# Patient Record
Sex: Female | Born: 1985 | Race: White | Hispanic: No | Smoking: Never smoker
Health system: Southern US, Community
[De-identification: ages and names within clinical notes are randomized; demographics above are authoritative.]

## PROBLEM LIST (undated history)

## (undated) ENCOUNTER — Emergency Department (HOSPITAL_BASED_OUTPATIENT_CLINIC_OR_DEPARTMENT_OTHER): Admission: EM | Payer: Medicaid Other | Source: Home / Self Care

## (undated) DIAGNOSIS — F419 Anxiety disorder, unspecified: Secondary | ICD-10-CM

## (undated) DIAGNOSIS — F909 Attention-deficit hyperactivity disorder, unspecified type: Secondary | ICD-10-CM

## (undated) HISTORY — PX: OTHER SURGICAL HISTORY: SHX169

---

## 2013-08-03 ENCOUNTER — Ambulatory Visit: Payer: Self-pay | Admitting: Physician Assistant

## 2015-03-22 IMAGING — CR DG KNEE COMPLETE 4+V*L*
1 series · 4 of 4 positions shown · non-contrast
Comparison: None available for comparison at time of study
interpretation.

CLINICAL DATA: Pain.

EXAM:
LEFT KNEE - COMPLETE 4+ VIEW

[Series 1: ap · 0.17mm/px · 4 of 4 slices shown]
[im 1/4]
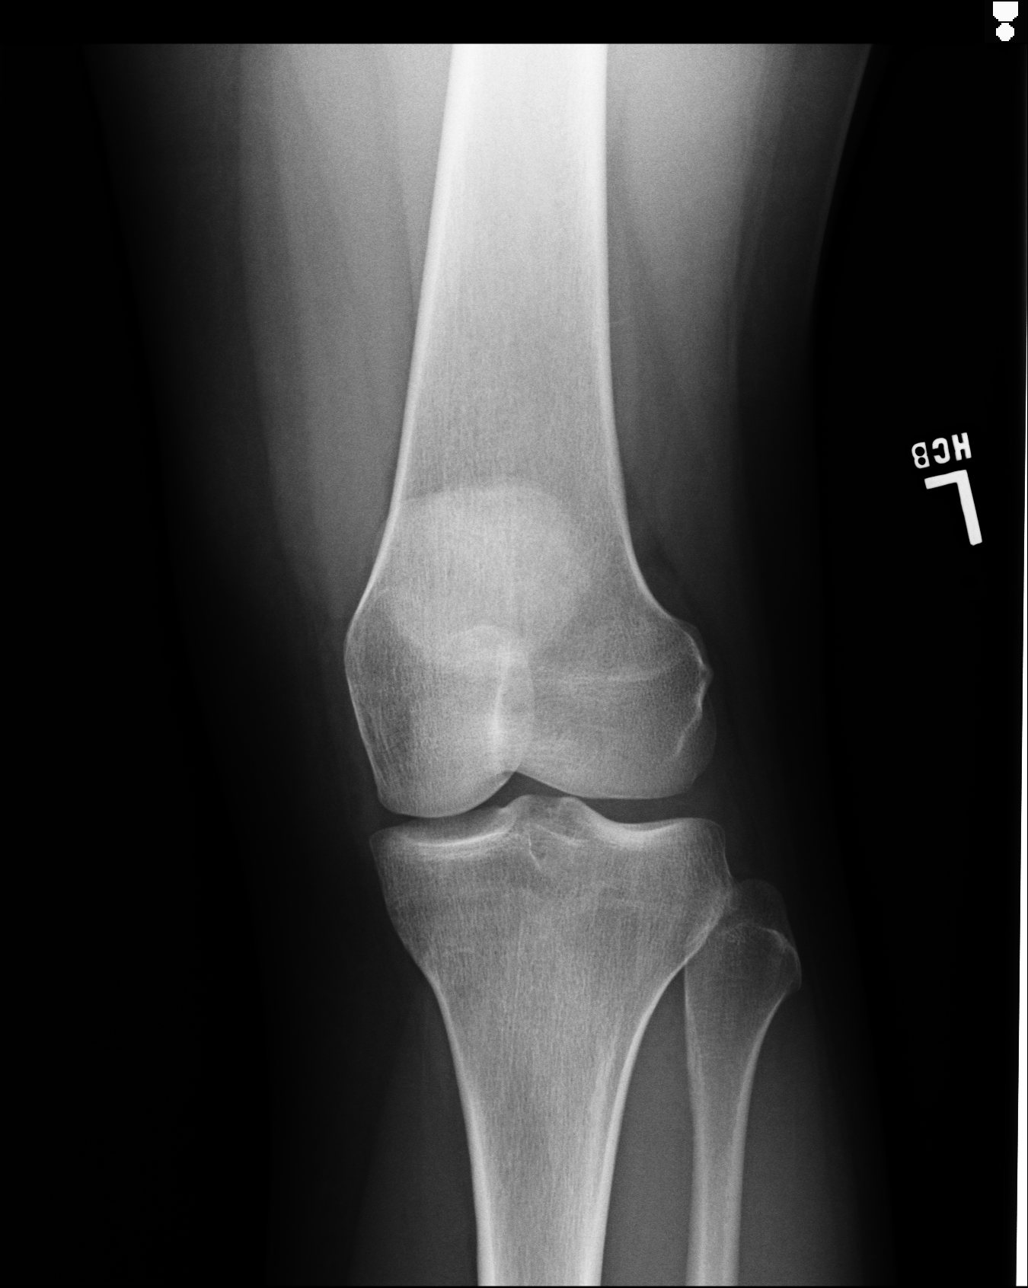
[im 2/4]
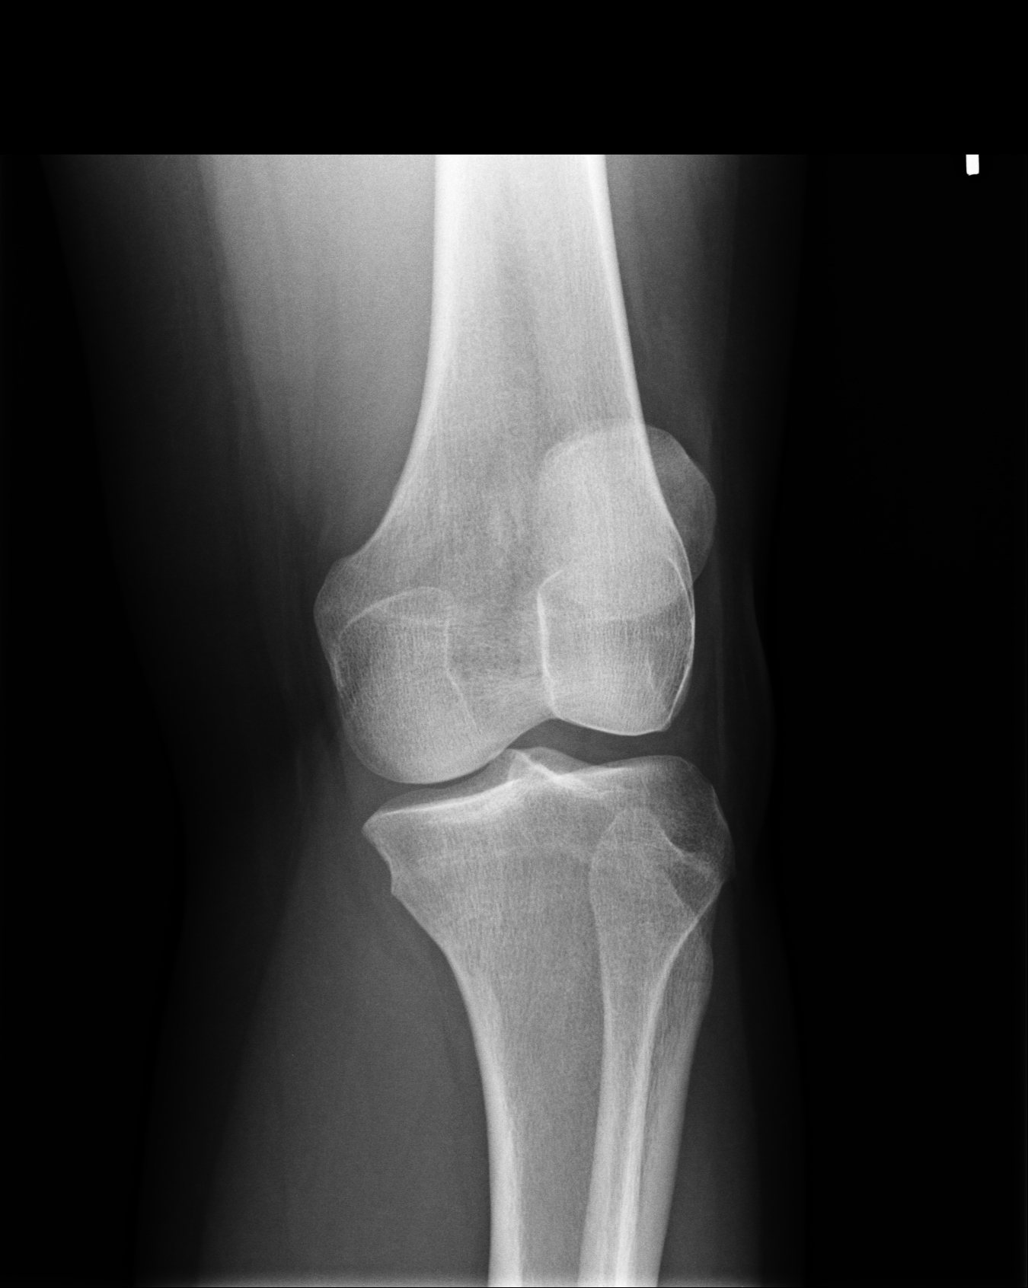
[im 3/4]
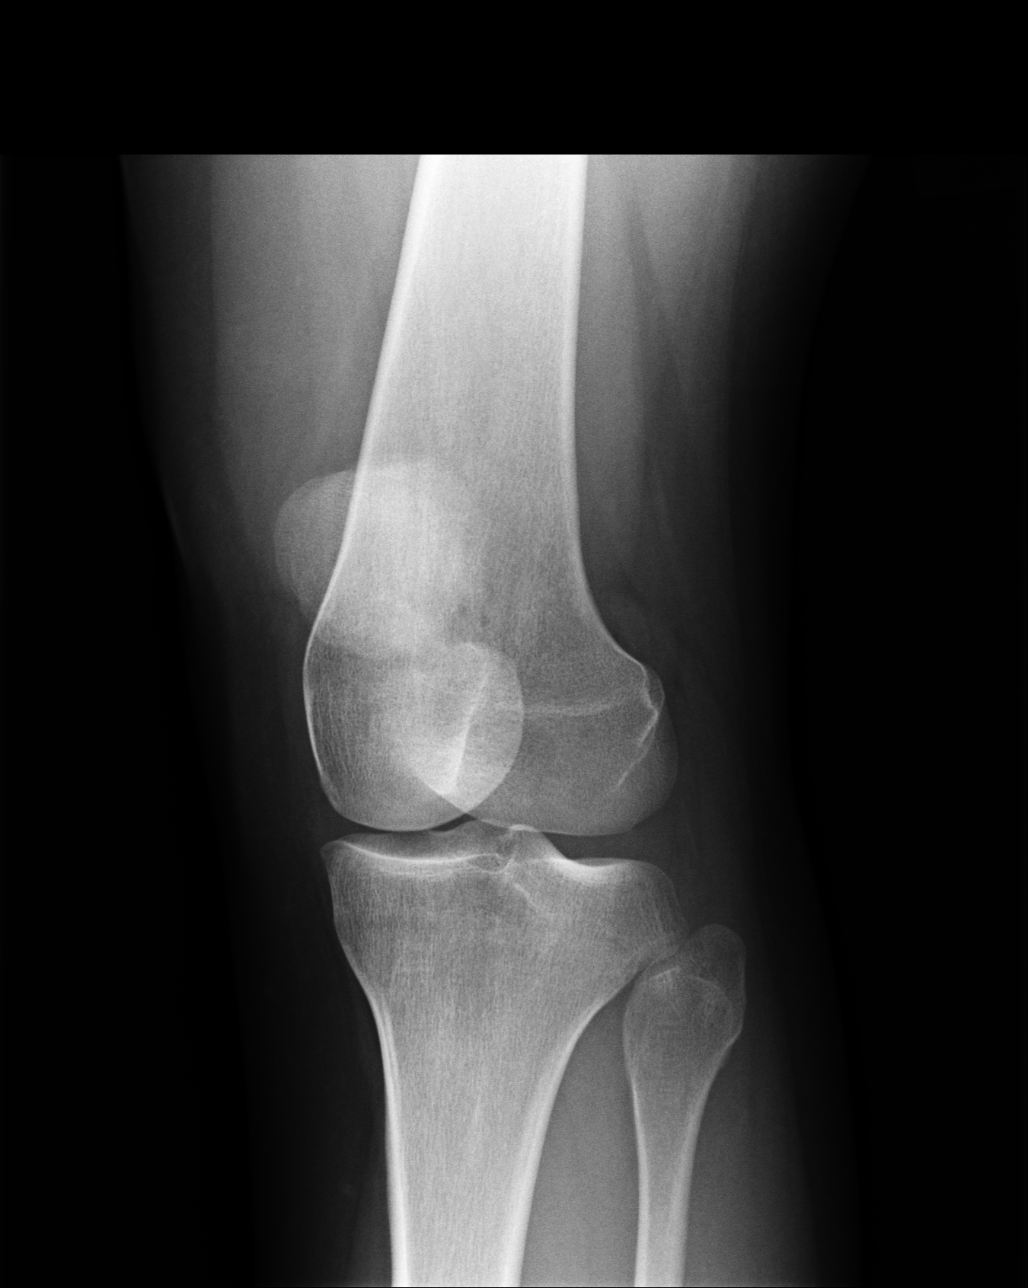
[im 4/4]
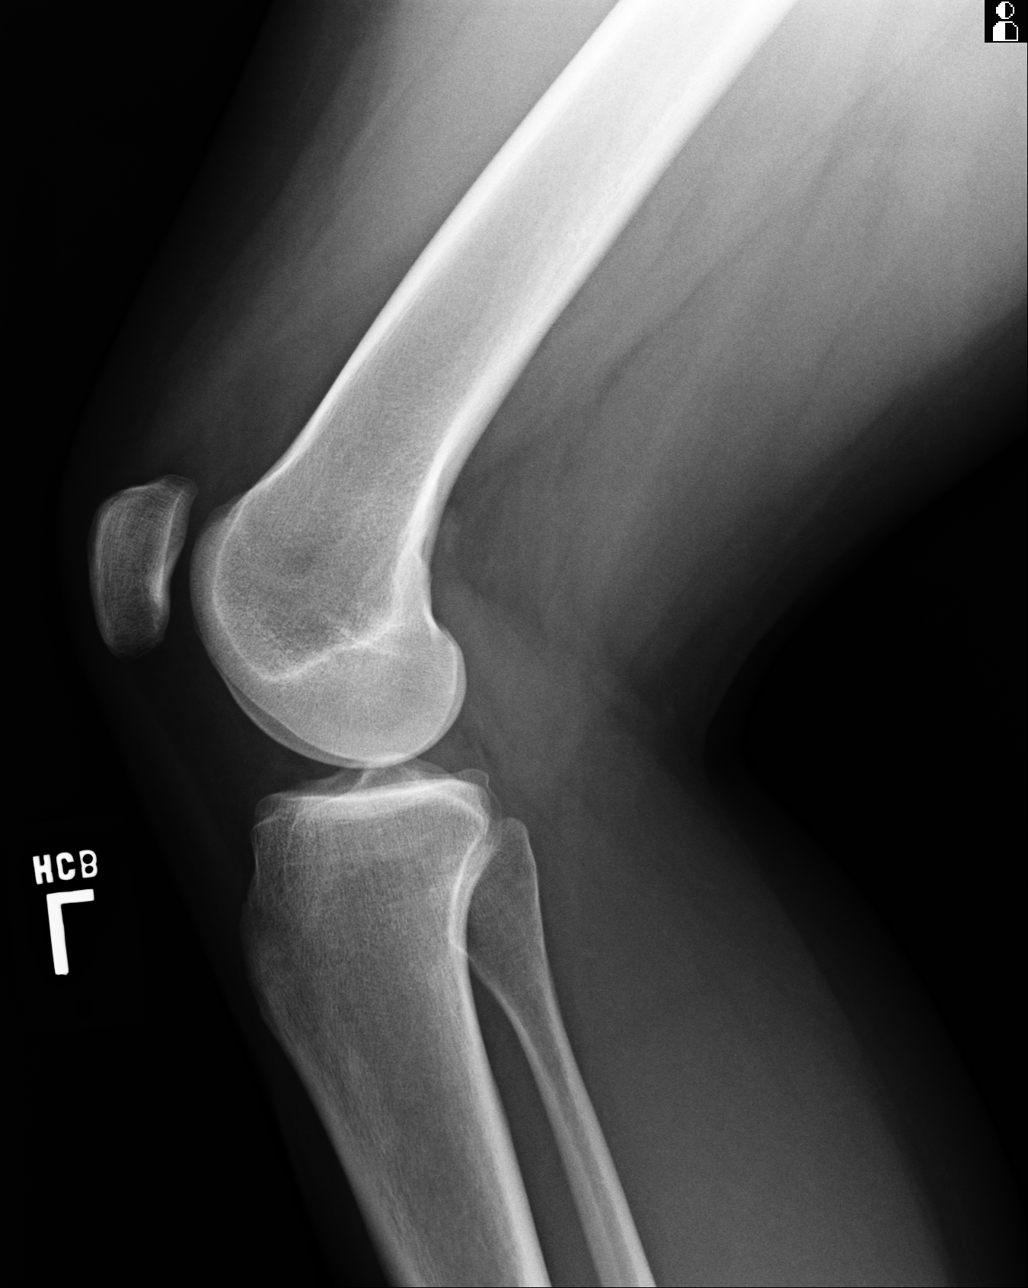

[4 of 4 positions shown; findings below may reference images not displayed]

FINDINGS: There is no evidence of fracture, dislocation, or joint effusion.
There is no evidence of arthropathy or other focal bone abnormality.
Soft tissues are nonsuspicious.
IMPRESSION: Negative.

  By: Pavani Lavigne

## 2015-07-04 ENCOUNTER — Ambulatory Visit
Admission: EM | Admit: 2015-07-04 | Discharge: 2015-07-04 | Disposition: A | Discharge status: Home / Self Care | Payer: Self-pay | Attending: Family Medicine | Admitting: Family Medicine

## 2015-07-04 DIAGNOSIS — R1084 Generalized abdominal pain: Secondary | ICD-10-CM

## 2015-07-04 DIAGNOSIS — R112 Nausea with vomiting, unspecified: Secondary | ICD-10-CM

## 2015-07-04 DIAGNOSIS — R197 Diarrhea, unspecified: Secondary | ICD-10-CM

## 2015-07-04 HISTORY — DX: Anxiety disorder, unspecified: F41.9

## 2015-07-04 LAB — RAPID INFLUENZA A&B ANTIGENS
Influenza A (ARMC): NEGATIVE
Influenza B (ARMC): NEGATIVE

## 2015-07-04 LAB — URINALYSIS COMPLETE WITH MICROSCOPIC (ARMC ONLY)
BILIRUBIN URINE: NEGATIVE
Bacteria, UA: NONE SEEN
Glucose, UA: NEGATIVE mg/dL
Hgb urine dipstick: NEGATIVE
KETONES UR: NEGATIVE mg/dL
LEUKOCYTES UA: NEGATIVE
Nitrite: NEGATIVE
PH: 7 (ref 5.0–8.0)
Protein, ur: NEGATIVE mg/dL
RBC / HPF: NONE SEEN RBC/hpf (ref 0–5)
Specific Gravity, Urine: 1.02 (ref 1.005–1.030)

## 2015-07-04 LAB — PREGNANCY, URINE: Preg Test, Ur: NEGATIVE

## 2015-07-04 MED ORDER — ONDANSETRON HCL 4 MG/2ML IJ SOLN: 4.0000 mg | Freq: Once | INTRAMUSCULAR | Status: DC

## 2015-07-04 MED ORDER — ACETAMINOPHEN 500 MG PO TABS
1000.0000 mg | ORAL_TABLET | Freq: Once | ORAL | Status: AC
  Administered 2015-07-04: 1000 mg via ORAL

## 2015-07-04 MED ORDER — ONDANSETRON 4 MG PO TBDP
4.0000 mg | ORAL_TABLET | Freq: Once | ORAL | Status: AC
  Administered 2015-07-04: 4 mg via ORAL

## 2015-07-04 MED ORDER — ONDANSETRON 4 MG PO TBDP: 4.0000 mg | ORAL_TABLET | Freq: Three times a day (TID) | ORAL | Status: DC | PRN

## 2015-07-04 NOTE — Discharge Instructions (Signed)
Take medication as prescribed. Rest. Drink plenty of fluids. Follow BRAT diet.   Follow up with your primary care physician this week as needed. Return to Urgent care or proceed to ER for continued abdominal pain, inability to tolerate food or fluids, dizziness, weakness, new or worsening concerns.    Abdominal Pain, Adult Many things can cause abdominal pain. Usually, abdominal pain is not caused by a disease and will improve without treatment. It can often be observed and treated at home. Your health care provider will do a physical exam and possibly order blood tests and X-rays to help determine the seriousness of your pain. However, in many cases, more time must pass before a clear cause of the pain can be found. Before that point, your health care provider may not know if you need more testing or further treatment. HOME CARE INSTRUCTIONS Monitor your abdominal pain for any changes. The following actions may help to alleviate any discomfort you are experiencing:  Only take over-the-counter or prescription medicines as directed by your health care provider.  Do not take laxatives unless directed to do so by your health care provider.  Try a clear liquid diet (broth, tea, or water) as directed by your health care provider. Slowly move to a bland diet as tolerated. SEEK MEDICAL CARE IF:  You have unexplained abdominal pain.  You have abdominal pain associated with nausea or diarrhea.  You have pain when you urinate or have a bowel movement.  You experience abdominal pain that wakes you in the night.  You have abdominal pain that is worsened or improved by eating food.  You have abdominal pain that is worsened with eating fatty foods.  You have a fever. SEEK IMMEDIATE MEDICAL CARE IF:  Your pain does not go away within 2 hours.  You keep throwing up (vomiting).  Your pain is felt only in portions of the abdomen, such as the right side or the left lower portion of the  abdomen.  You pass bloody or black tarry stools. MAKE SURE YOU:  Understand these instructions.  Will watch your condition.  Will get help right away if you are not doing well or get worse.   This information is not intended to replace advice given to you by your health care provider. Make sure you discuss any questions you have with your health care provider.   Document Released: 01/20/2005 Document Revised: 01/01/2015 Document Reviewed: 12/20/2012 Elsevier Interactive Patient Education 2016 [employer] Inc[employer]  Diarrhea Diarrhea is frequent loose and watery bowel movements. It can cause you to feel weak and dehydrated. Dehydration can cause you to become tired and thirsty, have a dry mouth, and have decreased urination that often is dark yellow. Diarrhea is a sign of another problem, most often an infection that will not last long. In most cases, diarrhea typically lasts 2-3 days. However, it can last longer if it is a sign of something more serious. It is important to treat your diarrhea as directed by your caregiver to lessen or prevent future episodes of diarrhea. CAUSES  Some common causes include:  Gastrointestinal infections caused by viruses, bacteria, or parasites.  Food poisoning or food allergies.  Certain medicines, such as antibiotics, chemotherapy, and laxatives.  Artificial sweeteners and fructose.  Digestive disorders. HOME CARE INSTRUCTIONS  Ensure adequate fluid intake (hydration): Have 1 cup (8 oz) of fluid for each diarrhea episode. Avoid fluids that contain simple sugars or sports drinks, fruit juices, whole milk products, and sodas. Your urine should be  clear or pale yellow if you are drinking enough fluids. Hydrate with an oral rehydration solution that you can purchase at pharmacies, retail stores, and online. You can prepare an oral rehydration solution at home by mixing the following ingredients together:   - tsp table salt.   tsp baking soda.   tsp salt  substitute containing potassium chloride.  1  tablespoons sugar.  1 L (34 oz) of water.  Certain foods and beverages may increase the speed at which food moves through the gastrointestinal (GI) tract. These foods and beverages should be avoided and include:  Caffeinated and alcoholic beverages.  High-fiber foods, such as raw fruits and vegetables, nuts, seeds, and whole grain breads and cereals.  Foods and beverages sweetened with sugar alcohols, such as xylitol, sorbitol, and mannitol.  Some foods may be well tolerated and may help thicken stool including:  Starchy foods, such as rice, toast, pasta, low-sugar cereal, oatmeal, grits, baked potatoes, crackers, and bagels.  Bananas.  Applesauce.  Add probiotic-rich foods to help increase healthy bacteria in the GI tract, such as yogurt and fermented milk products.  Wash your hands well after each diarrhea episode.  Only take over-the-counter or prescription medicines as directed by your caregiver.  Take a warm bath to relieve any burning or pain from frequent diarrhea episodes. SEEK IMMEDIATE MEDICAL CARE IF:   You are unable to keep fluids down.  You have persistent vomiting.  You have blood in your stool, or your stools are black and tarry.  You do not urinate in 6-8 hours, or there is only a small amount of very dark urine.  You have abdominal pain that increases or localizes.  You have weakness, dizziness, confusion, or light-headedness.  You have a severe headache.  Your diarrhea gets worse or does not get better.  You have a fever or persistent symptoms for more than 2-3 days.  You have a fever and your symptoms suddenly get worse. MAKE SURE YOU:   Understand these instructions.  Will watch your condition.  Will get help right away if you are not doing well or get worse.   This information is not intended to replace advice given to you by your health care provider. Make sure you discuss any questions you  have with your health care provider.   Document Released: 04/02/2002 Document Revised: 05/03/2014 Document Reviewed: 12/19/2011 Elsevier Interactive Patient Education 2016 [employer].  Food Choices to Help Relieve Diarrhea, Adult When you have diarrhea, the foods you eat and your eating habits are very important. Choosing the right foods and drinks can help relieve diarrhea. Also, because diarrhea can last up to 7 days, you need to replace lost fluids and electrolytes (such as sodium, potassium, and chloride) in order to help prevent dehydration.  WHAT GENERAL GUIDELINES DO I NEED TO FOLLOW?  Slowly drink 1 cup (8 oz) of fluid for each episode of diarrhea. If you are getting enough fluid, your urine will be clear or pale yellow.  Eat starchy foods. Some good choices include white rice, white toast, pasta, low-fiber cereal, baked potatoes (without the skin), saltine crackers, and bagels.  Avoid large servings of any cooked vegetables.  Limit fruit to two servings per day. A serving is  cup or 1 small piece.  Choose foods with less than 2 g of fiber per serving.  Limit fats to less than 8 tsp (38 g) per day.  Avoid fried foods.  Eat foods that have probiotics in them. Probiotics can be  found in certain dairy products.  Avoid foods and beverages that may increase the speed at which food moves through the stomach and intestines (gastrointestinal tract). Things to avoid include:  High-fiber foods, such as dried fruit, raw fruits and vegetables, nuts, seeds, and whole grain foods.  Spicy foods and high-fat foods.  Foods and beverages sweetened with high-fructose corn syrup, honey, or sugar alcohols such as xylitol, sorbitol, and mannitol. WHAT FOODS ARE RECOMMENDED? Grains White rice. White, [place], or pita breads (fresh or toasted), including plain rolls, buns, or bagels. White pasta. Saltine, soda, or graham crackers. Pretzels. Low-fiber cereal. Cooked cereals made with water (such  as cornmeal, farina, or cream cereals). Plain muffins. Matzo. Melba toast. Zwieback.  Vegetables Potatoes (without the skin). Strained tomato and vegetable juices. Most well-cooked and canned vegetables without seeds. Tender lettuce. Fruits Cooked or canned applesauce, apricots, cherries, fruit cocktail, grapefruit, peaches, pears, or plums. Fresh bananas, apples without skin, cherries, grapes, cantaloupe, grapefruit, peaches, oranges, or plums.  Meat and Other Protein Products Baked or boiled chicken. Eggs. Tofu. Fish. Seafood. Smooth peanut butter. Ground or well-cooked tender beef, ham, veal, lamb, pork, or poultry.  Dairy Plain yogurt, kefir, and unsweetened liquid yogurt. Lactose-free milk, buttermilk, or soy milk. Plain hard cheese. Beverages Sport drinks. Clear broths. Diluted fruit juices (except prune). Regular, caffeine-free sodas such as ginger ale. Water. Decaffeinated teas. Oral rehydration solutions. Sugar-free beverages not sweetened with sugar alcohols. Other Bouillon, broth, or soups made from recommended foods.  The items listed above may not be a complete list of recommended foods or beverages. Contact your dietitian for more options. WHAT FOODS ARE NOT RECOMMENDED? Grains Whole grain, whole wheat, bran, or rye breads, rolls, pastas, crackers, and cereals. Wild or brown rice. Cereals that contain more than 2 g of fiber per serving. Corn tortillas or taco shells. Cooked or dry oatmeal. Granola. Popcorn. Vegetables Raw vegetables. Cabbage, broccoli, Brussels sprouts, artichokes, baked beans, beet greens, corn, kale, legumes, peas, sweet potatoes, and yams. Potato skins. Cooked spinach and cabbage. Fruits Dried fruit, including raisins and dates. Raw fruits. Stewed or dried prunes. Fresh apples with skin, apricots, mangoes, pears, raspberries, and strawberries.  Meat and Other Protein Products Chunky peanut butter. Nuts and seeds. Beans and lentils. Tomasa Blase.  Dairy High-fat  cheeses. Milk, chocolate milk, and beverages made with milk, such as milk shakes. Cream. Ice cream. Sweets and Desserts Sweet rolls, doughnuts, and sweet breads. Pancakes and waffles. Fats and Oils Butter. Cream sauces. Margarine. Salad oils. Plain salad dressings. Olives. Avocados.  Beverages Caffeinated beverages (such as coffee, tea, soda, or energy drinks). Alcoholic beverages. Fruit juices with pulp. Prune juice. Soft drinks sweetened with high-fructose corn syrup or sugar alcohols. Other Coconut. Hot sauce. Chili powder. Mayonnaise. Gravy. Cream-based or milk-based soups.  The items listed above may not be a complete list of foods and beverages to avoid. Contact your dietitian for more information. WHAT SHOULD I DO IF I BECOME DEHYDRATED? Diarrhea can sometimes lead to dehydration. Signs of dehydration include dark urine and dry mouth and skin. If you think you are dehydrated, you should rehydrate with an oral rehydration solution. These solutions can be purchased at pharmacies, retail stores, or online.  Drink -1 cup (120-240 mL) of oral rehydration solution each time you have an episode of diarrhea. If drinking this amount makes your diarrhea worse, try drinking smaller amounts more often. For example, drink 1-3 tsp (5-15 mL) every 5-10 minutes.  A general rule for staying hydrated is to drink 1-2  L of fluid per day. Talk to your health care provider about the specific amount you should be drinking each day. Drink enough fluids to keep your urine clear or pale yellow.   This information is not intended to replace advice given to you by your health care provider. Make sure you discuss any questions you have with your health care provider.   Document Released: 07/03/2003 Document Revised: 05/03/2014 Document Reviewed: 03/05/2013 Elsevier Interactive Patient Education 2016 Elsevier Inc.  Nausea and Vomiting Nausea means you feel sick to your stomach. Throwing up (vomiting) is a reflex  where stomach contents come out of your mouth. HOME CARE   Take medicine as told by your doctor.  Do not force yourself to eat. However, you do need to drink fluids.  If you feel like eating, eat a normal diet as told by your doctor.  Eat rice, wheat, potatoes, bread, lean meats, yogurt, fruits, and vegetables.  Avoid high-fat foods.  Drink enough fluids to keep your pee (urine) clear or pale yellow.  Ask your doctor how to replace body fluid losses (rehydrate). Signs of body fluid loss (dehydration) include:  Feeling very thirsty.  Dry lips and mouth.  Feeling dizzy.  Dark pee.  Peeing less than normal.  Feeling confused.  Fast breathing or heart rate. GET HELP RIGHT AWAY IF:   You have blood in your throw up.  You have black or bloody poop (stool).  You have a bad headache or stiff neck.  You feel confused.  You have bad belly (abdominal) pain.  You have chest pain or trouble breathing.  You do not pee at least once every 8 hours.  You have cold, clammy skin.  You keep throwing up after 24 to 48 hours.  You have a fever. MAKE SURE YOU:   Understand these instructions.  Will watch your condition.  Will get help right away if you are not doing well or get worse.   This information is not intended to replace advice given to you by your health care provider. Make sure you discuss any questions you have with your health care provider.   Document Released: 09/29/2007 Document Revised: 07/05/2011 Document Reviewed: 09/11/2010 Elsevier Interactive Patient Education [employer]

## 2015-07-04 NOTE — ED Provider Notes (Signed)
[facility]  ____________________________________________  Time seen: Approximately 2:39 PM  I have reviewed the triage vital signs and the nursing notes.   HISTORY  Chief Complaint Abdominal Pain   HPI Maria Jones is a 30 y.o. female presents with spouse at bedside with complaints of onset of nausea, vomiting, diarrhea and abdominal pain since 3 AM this morning. Patient reports that she has had countless episodes of diarrhea today but with 2-3 episodes of vomiting. Patient states that she has had continued nausea. Patient states that her abdominal pain is a crampy generalized abdominal pain that gets worse) needing to vomit or have diarrhea and then improves after vomiting or diarrhea. Denies abnormal colors or vomiting or diarrhea. Denies blood in stool or vomit and denies stool or vomit color changes. Reports has tolerated some Gatorade today.  States current abdominal discomfort is generalized at 8 out of 10 cramping. Patient states that her daughter also woke up in the middle the night with similar symptoms and has continued with vomiting and diarrhea today. Denies known trigger. Denies changes in foods. Denies recent sickness. Also reports accompanying chills and states that she had a 101 fever earlier. States has not taken any medications today.  Denies dysuria, vaginal discharge, vaginal complaints, neck pain, back pain, rash, recent sickness.     Past Medical History  Diagnosis Date  . Anxiety     There are no active problems to display for this patient.   Past Surgical History  Procedure Laterality Date  . C section     . Leep surgery      Current Outpatient Rx  Name  Route  Sig  Dispense  Refill  . LORazepam (ATIVAN) 0.5 MG tablet   Oral   Take 0.5 mg by mouth as needed for anxiety.         .             Allergies Antihistamines, chlorpheniramine-type  History reviewed. No pertinent family history.  Social History Social History   Substance Use Topics  . Smoking status: Never Smoker   . Smokeless tobacco: Never Used  . Alcohol Use: No    Review of Systems Constitutional: positive chills.  Eyes: No visual changes. ENT: No sore throat. Cardiovascular: Denies chest pain. Respiratory: Denies shortness of breath. Gastrointestinal: as above.  No constipation. Genitourinary: Negative for dysuria. Musculoskeletal: Negative for back pain. Skin: Negative for rash. Neurological: Negative for headaches, focal weakness or numbness.  10-point ROS otherwise negative.  ____________________________________________   PHYSICAL EXAM:  VITAL SIGNS: ED Triage Vitals  Enc Vitals Group     BP 07/04/15 1340 101/59 mmHg     Pulse Rate 07/04/15 1340 110     Resp 07/04/15 1340 19     Temp 07/04/15 1340 100.2 F (37.9 C)     Temp Source 07/04/15 1340 Oral     SpO2 07/04/15 1340 100 %     Weight 07/04/15 1340 134 lb (60.782 kg)     Height 07/04/15 1340 5\' 6"  (1.676 m)     Head Cir --      Peak Flow --      Pain Score 07/04/15 1346 10     Pain Loc --      Pain Edu? --      Excl. in GC? --    Today's Vitals   07/04/15 1340 07/04/15 1346 07/04/15 1515 07/04/15 1538  BP: 101/59  106/65   Pulse: 110  105   Temp: 100.2 F (  37.9 C)  98.9 F (37.2 C)   TempSrc: Oral  Oral   Resp: 19  20   Height:  (1.676 m)     Weight: 134 lb (60.782 kg)     SpO2: 100%  98%   PainSc:  10-Worst pain ever  8      Constitutional: Alert and oriented. Well appearing and in no acute distress. Eyes: Conjunctivae are normal. PERRL. EOMI. Head: Atraumatic.  Ears: no erythema, normal TMs bilaterally.   Nose: No congestion/rhinnorhea.  Mouth/Throat: Mucous membranes are moist.  Oropharynx non-erythematous. Neck: No stridor.  No cervical spine tenderness to palpation. Hematological/Lymphatic/Immunilogical: No cervical lymphadenopathy. Cardiovascular: Normal rate, regular rhythm. Grossly normal heart sounds.  Good peripheral  circulation. Respiratory: Normal respiratory effort.  No retractions. Lungs CTAB. Gastrointestinal: soft, diffuse mild to moderate tenderness to palpation. Normal Bowel sounds. No CVA tenderness. Musculoskeletal: No lower or upper extremity tenderness nor edema.  No cervical, thoracic or lumbar tenderness to palpation.  Neurologic:  Normal speech and language. No gross focal neurologic deficits are appreciated. No gait instability. Skin:  Skin is warm, dry and intact. No rash noted. Psychiatric: Mood and affect are normal. Speech and behavior are normal.  ____________________________________________   LABS (all labs ordered are listed, but only abnormal results are displayed)  Labs Reviewed  URINALYSIS COMPLETEWITH MICROSCOPIC ([facility] ONLY) - Abnormal; Notable for the following:    Squamous Epithelial / LPF 0-5 (*)    All other components within normal limits  RAPID INFLUENZA A&B ANTIGENS ([facility] ONLY)  URINE CULTURE  PREGNANCY, URINE    INITIAL IMPRESSION / ASSESSMENT AND PLAN / ED COURSE  Pertinent labs & imaging results that were available during my care of the patient were reviewed by me and considered in my medical decision making (see chart for details).  Overall well-appearing patient. No acute distress. Spouse at bedside. Nausea vomiting diarrhea with abdominal pain since 3 AM this morning. No vomiting or diarrhea while in urgent care. OT 4 mg Zofran given, patient reports that after Zofran nausea improving as well as abdominal complaints. Diffuse abdominal tenderness. Urinalysis unremarkable. Urine pregnancy test negative. Influenza test negative.Patient states that she needs to leave urgent care to take her daughter to the doctor. Patient states that she does not want to stay for by mouth challenge or for laboratory studies. Suspect viral gastroenteritis and discussed with patient treatment with ODT Zofran, fluids, BRAT diet. As patient states that she cannot wait in urgent care and  wants to be discharged at this time, counseled patient very strict follow-up and return parameters. Counseled patient to go directly to emergency room as needed for increased abdominal pain, inability to tolerate food or fluids, new or worsening concerns. Patient verbalized understanding and agreed to this plan.   Discussed follow up with Primary care physician this week. Discussed follow up and return parameters including no resolution or any worsening concerns. Patient verbalized understanding and agreed to plan.   ____________________________________________   FINAL CLINICAL IMPRESSION(S) / ED DIAGNOSES  Final diagnoses:  Nausea vomiting and diarrhea  Generalized abdominal pain      Note: This dictation was prepared with Dragon dictation along with smaller phrase technology. Any transcriptional errors that result from this process are unintentional.    Renford Dills, NP 07/04/15 1552

## 2015-07-04 NOTE — ED Notes (Signed)
Patient c/o stomach pain, body aches, chills, diarrhea, and n/v which started at 3:00am this morning.

## 2015-07-06 LAB — URINE CULTURE

## 2016-04-25 ENCOUNTER — Encounter: Payer: Self-pay | Admitting: Emergency Medicine

## 2016-04-25 ENCOUNTER — Emergency Department
Admission: EM | Admit: 2016-04-25 | Discharge: 2016-04-25 | Disposition: A | Discharge status: Home / Self Care | Payer: Medicaid Other | Attending: Emergency Medicine | Admitting: Emergency Medicine

## 2016-04-25 DIAGNOSIS — R509 Fever, unspecified: Secondary | ICD-10-CM | POA: Diagnosis present

## 2016-04-25 DIAGNOSIS — J069 Acute upper respiratory infection, unspecified: Secondary | ICD-10-CM | POA: Diagnosis not present

## 2016-04-25 LAB — CBC WITH DIFFERENTIAL/PLATELET
BASOS ABS: 0.1 10*3/uL (ref 0–0.1)
Basophils Relative: 1 %
Eosinophils Absolute: 0.1 10*3/uL (ref 0–0.7)
Eosinophils Relative: 1 %
HEMATOCRIT: 36.5 % (ref 35.0–47.0)
HEMOGLOBIN: 12.5 g/dL (ref 12.0–16.0)
LYMPHS PCT: 28 %
Lymphs Abs: 1.7 10*3/uL (ref 1.0–3.6)
MCH: 29.4 pg (ref 26.0–34.0)
MCHC: 34.2 g/dL (ref 32.0–36.0)
MCV: 85.8 fL (ref 80.0–100.0)
MONO ABS: 0.4 10*3/uL (ref 0.2–0.9)
MONOS PCT: 6 %
NEUTROS ABS: 4 10*3/uL (ref 1.4–6.5)
NEUTROS PCT: 64 %
Platelets: 226 10*3/uL (ref 150–440)
RBC: 4.26 MIL/uL (ref 3.80–5.20)
RDW: 15.1 % — AB (ref 11.5–14.5)
WBC: 6.3 10*3/uL (ref 3.6–11.0)

## 2016-04-25 LAB — BASIC METABOLIC PANEL
ANION GAP: 7 (ref 5–15)
BUN: 8 mg/dL (ref 6–20)
CALCIUM: 9.4 mg/dL (ref 8.9–10.3)
CHLORIDE: 105 mmol/L (ref 101–111)
CO2: 25 mmol/L (ref 22–32)
Creatinine, Ser: 0.65 mg/dL (ref 0.44–1.00)
GFR calc Af Amer: >60 mL/min (ref >60)
GFR calc non Af Amer: >60 mL/min (ref >60)
GLUCOSE: 90 mg/dL (ref 65–99)
POTASSIUM: 3.7 mmol/L (ref 3.5–5.1)
Sodium: 137 mmol/L (ref 135–145)

## 2016-04-25 LAB — POCT RAPID STREP A: STREPTOCOCCUS, GROUP A SCREEN (DIRECT): NEGATIVE

## 2016-04-25 LAB — INFLUENZA PANEL BY PCR (TYPE A & B)
Influenza A By PCR: NEGATIVE
Influenza B By PCR: NEGATIVE

## 2016-04-25 MED ORDER — AZITHROMYCIN 250 MG PO TABS: ORAL_TABLET | ORAL | 0 refills | Status: DC

## 2016-04-25 NOTE — ED Notes (Signed)
Pt reports a lump on the right side of her neck for approx 1 month and it has caused some ear pain  She reports last night she began experiencing fever and chills  5/10 ear pain upon assessment

## 2016-04-25 NOTE — ED Provider Notes (Signed)
[facility] Emergency Department Provider Note   ____________________________________________   None    (approximate)  I have reviewed the triage vital signs and the nursing notes.   HISTORY  Chief Complaint Fever and Otalgia    HPI Maria Jones is a 30 y.o. female presents for evaluation of right-sided earache right-sided lymph node swelling. MAXIMUM TEMPERATURE 101. States that she's had a sick daughter at home and wants to make sure she isn't getting sick. Complains of congestion.   Past Medical History:  Diagnosis Date  . Anxiety     There are no active problems to display for this patient.   Past Surgical History:  Procedure Laterality Date  . C Section     . LEEP Surgery      Prior to Admission medications   Medication Sig Start Date End Date Taking? Authorizing Provider  azithromycin (ZITHROMAX Z-PAK) 250 MG tablet Take 2 tablets (500 mg) on  Day 1,  followed by 1 tablet (250 mg) once daily on Days 2 through 5. 04/25/16   Charmayne Sheerharles M Freedom Lopezperez, PA-C  LORazepam (ATIVAN) 0.5 MG tablet Take 0.5 mg by mouth as needed for anxiety.    Historical Provider, MD  ondansetron (ZOFRAN ODT) 4 MG disintegrating tablet Take 1 tablet (4 mg total) by mouth every 8 (eight) hours as needed for nausea or vomiting. 07/04/15   Renford DillsLindsey Miller, NP    Allergies Antihistamines, chlorpheniramine-type  No family history on file.  Social History Social History  Substance Use Topics  . Smoking status: Never Smoker  . Smokeless tobacco: Never Used  . Alcohol use No    Review of Systems Constitutional: Positive fever/chills Eyes: No visual changes. ENT: Positive sore throat. Cardiovascular: Denies chest pain. Respiratory: Denies shortness of breath. Gastrointestinal: No abdominal pain.  No nausea, no vomiting.  No diarrhea.  No constipation. Genitourinary: Negative for dysuria. Musculoskeletal: Negative for back pain. Skin: Negative for  rash. Neurological: Positive for headaches, focal weakness or numbness.  10-point ROS otherwise negative.  ____________________________________________   PHYSICAL EXAM:  VITAL SIGNS: ED Triage Vitals  Enc Vitals Group     BP 04/25/16 1211 136/83     Pulse Rate 04/25/16 1211 83     Resp 04/25/16 1211 16     Temp 04/25/16 1211 98.1 F (36.7 C)     Temp Source 04/25/16 1211 Oral     SpO2 04/25/16 1211 100 %     Weight 04/25/16 1212 137 lb (62.1 kg)     Height 04/25/16 1212 5' 6.5" (1.689 m)     Head Circumference --      Peak Flow --      Pain Score 04/25/16 1214 6     Pain Loc --      Pain Edu? --      Excl. in GC? --     Constitutional: Alert and oriented. Well appearing and in no acute distress. Eyes: Conjunctivae are normal. PERRL. EOMI. Head: Atraumatic. Nose: No congestion/rhinnorhea. Mouth/Throat: Mucous membranes are moist.  Oropharynx non-erythematous. Neck: No stridor.   Cardiovascular: Normal rate, regular rhythm. Grossly normal heart sounds.  Good peripheral circulation. Respiratory: Normal respiratory effort.  No retractions. Lungs CTAB. Gastrointestinal: Soft and nontender. No distention. No abdominal bruits. No CVA tenderness. Musculoskeletal: No lower extremity tenderness nor edema.  No joint effusions. Neurologic:  Normal speech and language. No gross focal neurologic deficits are appreciated. No gait instability. Skin:  Skin is warm, dry and intact. No rash noted. Psychiatric:  Mood and affect are normal. Speech and behavior are normal.  ____________________________________________   LABS (all labs ordered are listed, but only abnormal results are displayed)  Labs Reviewed  CBC WITH DIFFERENTIAL/PLATELET - Abnormal; Notable for the following:       Result Value   RDW 15.1 (*)    All other components within normal limits  BASIC METABOLIC PANEL  INFLUENZA PANEL BY PCR (TYPE A & B, H1N1)  POCT RAPID STREP A    ____________________________________________  EKG   ____________________________________________  RADIOLOGY   ____________________________________________   PROCEDURES  Procedure(s) performed: None  Procedures  Critical Care performed: No  ____________________________________________   INITIAL IMPRESSION / ASSESSMENT AND PLAN / ED COURSE  Pertinent labs & imaging results that were available during my care of the patient were reviewed by me and considered in my medical decision making (see chart for details).  Acute URI. Rx given for Z-Pak. Reassurance provided to the patient she is to follow-up PCP or return to ER for any worsening symptomology.  Clinical Course      ____________________________________________   FINAL CLINICAL IMPRESSION(S) / ED DIAGNOSES  Final diagnoses:  Upper respiratory tract infection, unspecified type      NEW MEDICATIONS STARTED DURING THIS VISIT:  Discharge Medication List as of 04/25/2016  4:06 PM    START taking these medications   Details  azithromycin (ZITHROMAX Z-PAK) 250 MG tablet Take 2 tablets (500 mg) on  Day 1,  followed by 1 tablet (250 mg) once daily on Days 2 through 5., Print         Note:  This document was prepared using Dragon voice recognition software and may include unintentional dictation errors.   Evangeline Dakinharles M Lucrezia Dehne, PA-C 04/25/16 1645    Willy EddyPatrick Robinson, MD 04/25/16 [phone]

## 2016-04-25 NOTE — ED Triage Notes (Signed)
Pt comes into the ED via POV c/o right sided otalgia and fever at 101.  Patient has had a sick daughter for the past week and she would like to be checked to make sure she isnt getting it.  Patient in NAD at this time with even and unlabored respirations and ambulatory to the triage room.

## 2016-06-02 ENCOUNTER — Ambulatory Visit
Admission: EM | Admit: 2016-06-02 | Discharge: 2016-06-02 | Disposition: A | Discharge status: Home / Self Care | Payer: Self-pay | Attending: Family Medicine | Admitting: Family Medicine

## 2016-06-02 DIAGNOSIS — R69 Illness, unspecified: Secondary | ICD-10-CM

## 2016-06-02 DIAGNOSIS — J111 Influenza due to unidentified influenza virus with other respiratory manifestations: Secondary | ICD-10-CM

## 2016-06-02 LAB — RAPID INFLUENZA A&B ANTIGENS (ARMC ONLY): INFLUENZA B (ARMC): NEGATIVE

## 2016-06-02 LAB — RAPID INFLUENZA A&B ANTIGENS: Influenza A (ARMC): NEGATIVE

## 2016-06-02 MED ORDER — OSELTAMIVIR PHOSPHATE 75 MG PO CAPS: 75.0000 mg | ORAL_CAPSULE | Freq: Two times a day (BID) | ORAL | 0 refills | Status: DC

## 2016-06-02 NOTE — Discharge Instructions (Signed)
Take medication as prescribed. Rest. Drink plenty of fluids.  ° °Follow up with your primary care physician this week as needed. Return to Urgent care for new or worsening concerns.  ° °

## 2016-06-02 NOTE — ED Triage Notes (Signed)
Patient complains of headache, sore throat and fatigue that started tonight while at class. Patient states that this is how she feels before she is going to get sick and has been exposed to the flu.

## 2016-06-02 NOTE — ED Provider Notes (Signed)
[facility] ____________________________________________  Time seen: Approximately 8:40 PM  I have reviewed the triage vital signs and the nursing notes.   HISTORY  Chief Complaint Headache   HPI Maria Jones is a 31 y.o. female  patient presenting for the complaint a few hours of feeling tired with accompanying onset of sore throat, chills, body aches and headache. Patient denies cough or runny nose. States face fells congestion, no drainage.States sore throat is described as mild and scratchy. Patient reports she has the feeling that she is getting sick. Patient reports that she is frequently exposed to sick people while at work as well as at school. Denies taking medications for the same complaint. Patient reports that she checked her temperature prior to arrival and her temperature was 100 tympanically, but reports she is not taking any medications or antipyretics. Denies recent sickness. Reports continues to eat and drink well. Patient expressed concern of influenza. Patient father expressed concern has her daughter is immunocompromised.  Denies chest pain, shortness of breath, abdominal pain, dysuria, extremity pain, extremity swelling or rash. Denies recent sickness. Denies recent antibiotic use.   Patient's last menstrual period was 05/27/2016. Denies pregnancy.   Past Medical History:  Diagnosis Date  . Anxiety     There are no active problems to display for this patient.   Past Surgical History:  Procedure Laterality Date  . C Section     . LEEP Surgery       No current facility-administered medications for this encounter.   Current Outpatient Prescriptions:  .  LORazepam (ATIVAN) 0.5 MG tablet, Take 0.5 mg by mouth as needed for anxiety., Disp: , Rfl:  .  azithromycin (ZITHROMAX Z-PAK) 250 MG tablet, Take 2 tablets (500 mg) on  Day 1,  followed by 1 tablet (250 mg) once daily on Days 2 through 5., Disp: 6 each, Rfl: 0 .  ondansetron (ZOFRAN ODT)  4 MG disintegrating tablet, Take 1 tablet (4 mg total) by mouth every 8 (eight) hours as needed for nausea or vomiting., Disp: 15 tablet, Rfl: 0 .  oseltamivir (TAMIFLU) 75 MG capsule, Take 1 capsule (75 mg total) by mouth every 12 (twelve) hours., Disp: 10 capsule, Rfl: 0  Allergies Antihistamines, chlorpheniramine-type  History reviewed. No pertinent family history.  Social History Social History  Substance Use Topics  . Smoking status: Never Smoker  . Smokeless tobacco: Never Used  . Alcohol use No    Review of Systems Constitutional:  As above. Eyes: No visual changes. ENT: Positive sore throat. Cardiovascular: Denies chest pain. Respiratory: Denies shortness of breath. Gastrointestinal: No abdominal pain.  No nausea, no vomiting.  No diarrhea.  No constipation. Genitourinary: Negative for dysuria. Musculoskeletal: Negative for back pain. Skin: Negative for rash. Neurological: Negative for headaches, focal weakness or numbness.   10-point ROS otherwise negative.  ____________________________________________   PHYSICAL EXAM:  VITAL SIGNS: ED Triage Vitals  Enc Vitals Group     BP 06/02/16 1951 125/85     Pulse Rate 06/02/16 1951 84     Resp 06/02/16 1951 16     Temp 06/02/16 1951 98.3 F (36.8 C)     Temp Source 06/02/16 1951 Oral     SpO2 06/02/16 1951 100 %     Weight 06/02/16 1950 135 lb (61.2 kg)     Height 06/02/16 1950 5\' 6"  (1.676 m)     Head Circumference --      Peak Flow --      Pain Score 06/02/16  1952 5     Pain Loc --      Pain Edu? --      Excl. in GC? --     Constitutional: Alert and oriented. Well appearing and in no acute distress. Eyes: Conjunctivae are normal. PERRL. EOMI. Head: Atraumatic. No sinus tenderness to palpation. No swelling. No erythema.  Ears: no erythema, normal TMs bilaterally.   Nose:Nasal congestion with clear rhinorrhea  Mouth/Throat: Mucous membranes are moist. Mild pharyngeal erythema. No tonsillar swelling or  exudate.  Neck: No stridor.  No cervical spine tenderness to palpation. Hematological/Lymphatic/Immunilogical: No cervical lymphadenopathy. Cardiovascular: Normal rate, regular rhythm. Grossly normal heart sounds.  Good peripheral circulation. Respiratory: Normal respiratory effort.  No retractions. No wheezes, rales or rhonchi. Good air movement.  Gastrointestinal: Soft and nontender. No CVA tenderness. Musculoskeletal: Ambulatory with steady gait. No cervical, thoracic or lumbar tenderness to palpation. Neurologic:  Normal speech and language. No gait instability. Skin:  Skin appears warm, dry and intact. No rash noted. Psychiatric: Mood and affect are normal. Speech and behavior are normal.   ___________________________________________   LABS (all labs ordered are listed, but only abnormal results are displayed)  Labs Reviewed  RAPID INFLUENZA A&B ANTIGENS ([facility] ONLY)   RADIOLOGY  No results found. ____________________________________________   PROCEDURES Procedures    INITIAL IMPRESSION / ASSESSMENT AND PLAN / ED COURSE  Pertinent labs & imaging results that were available during my care of the patient were reviewed by me and considered in my medical decision making (see chart for details).   Very well-appearing patient. No acute distress. Presents for acute onset of symptoms above. Patient expressed concern of influenza as recently exposed to sick contacts, as well as she reports that her daughter had been immunocompromised but recently doing better. Patient states that she wants no she has the flu for her daughter sick. Discussed with patient clinical appearance diagnosis, as well as risk of false negative influenza test. Recommend strep test. Patient further request influenza swab. Will test for strep as well as influenza.   Per RN Jeannett SeniorStephen, patient declined strep swab and request proceed with influenza swab. Influenza test negative.discussed with the patient concerned for  influenza and patient request treatment with oral Tamiflu. Encouraged supportive care, rest, fluids and PCP follow-up as needed.  Discussed follow up with Primary care physician this week. Discussed follow up and return parameters including no resolution or any worsening concerns. Patient verbalized understanding and agreed to plan.   ____________________________________________   FINAL CLINICAL IMPRESSION(S) / ED DIAGNOSES  Final diagnoses:  Influenza-like illness     Discharge Medication List as of 06/02/2016  9:11 PM    START taking these medications   Details  oseltamivir (TAMIFLU) 75 MG capsule Take 1 capsule (75 mg total) by mouth every 12 (twelve) hours., Starting Wed 06/02/2016, Normal        Note: This dictation was prepared with Dragon dictation along with smaller phrase technology. Any transcriptional errors that result from this process are unintentional.         Renford DillsLindsey Kameran Mcneese, NP 06/02/16 2200

## 2016-06-03 ENCOUNTER — Telehealth: Payer: Self-pay

## 2016-06-03 NOTE — Telephone Encounter (Signed)
Patient called in today to state that she is not going to start the tamiflu since she read the side effects and they made her nervous. I told patient this was completely her decision and if she felt uneasy about Rx then she did not have to start it. Patient verbalized understanding. [facility]

## 2016-07-03 ENCOUNTER — Emergency Department
Admission: EM | Admit: 2016-07-03 | Discharge: 2016-07-03 | Disposition: A | Discharge status: Home / Self Care | Payer: Medicaid Other | Attending: Emergency Medicine | Admitting: Emergency Medicine

## 2016-07-03 ENCOUNTER — Emergency Department: Payer: Medicaid Other

## 2016-07-03 ENCOUNTER — Encounter: Payer: Self-pay | Admitting: *Deleted

## 2016-07-03 DIAGNOSIS — Y999 Unspecified external cause status: Secondary | ICD-10-CM | POA: Diagnosis not present

## 2016-07-03 DIAGNOSIS — W51XXXA Accidental striking against or bumped into by another person, initial encounter: Secondary | ICD-10-CM | POA: Insufficient documentation

## 2016-07-03 DIAGNOSIS — Y939 Activity, unspecified: Secondary | ICD-10-CM | POA: Diagnosis not present

## 2016-07-03 DIAGNOSIS — S00432A Contusion of left ear, initial encounter: Secondary | ICD-10-CM | POA: Insufficient documentation

## 2016-07-03 DIAGNOSIS — S00402A Unspecified superficial injury of left ear, initial encounter: Secondary | ICD-10-CM | POA: Diagnosis present

## 2016-07-03 DIAGNOSIS — S0990XA Unspecified injury of head, initial encounter: Secondary | ICD-10-CM | POA: Insufficient documentation

## 2016-07-03 DIAGNOSIS — Y929 Unspecified place or not applicable: Secondary | ICD-10-CM | POA: Insufficient documentation

## 2016-07-03 DIAGNOSIS — T148XXA Other injury of unspecified body region, initial encounter: Secondary | ICD-10-CM

## 2016-07-03 MED ORDER — CYCLOBENZAPRINE HCL 10 MG PO TABS: 10.0000 mg | ORAL_TABLET | Freq: Three times a day (TID) | ORAL | 0 refills | Status: DC | PRN

## 2016-07-03 MED ORDER — NAPROXEN 500 MG PO TABS: 500.0000 mg | ORAL_TABLET | Freq: Two times a day (BID) | ORAL | 0 refills | Status: DC

## 2016-07-03 NOTE — ED Notes (Signed)
Pt verbalized understanding of discharge instructions. NAD at this time. 
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
## 2016-07-03 NOTE — Discharge Instructions (Signed)
Please follow up with primary care provider for choice for symptoms that are not resolving over the week. Return to the emergency department for symptoms that change or worsen if you are unable to schedule an appointment.
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
## 2016-11-03 ENCOUNTER — Emergency Department
Admission: EM | Admit: 2016-11-03 | Discharge: 2016-11-03 | Disposition: A | Discharge status: Home / Self Care | Payer: Medicaid Other | Attending: Student in an Organized Health Care Education/Training Program | Admitting: Student in an Organized Health Care Education/Training Program

## 2016-11-03 DIAGNOSIS — Z79899 Other long term (current) drug therapy: Secondary | ICD-10-CM | POA: Insufficient documentation

## 2016-11-03 DIAGNOSIS — Z036 Encounter for observation for suspected toxic effect from ingested substance ruled out: Secondary | ICD-10-CM | POA: Diagnosis present

## 2016-11-03 DIAGNOSIS — T43291A Poisoning by other antidepressants, accidental (unintentional), initial encounter: Secondary | ICD-10-CM | POA: Insufficient documentation

## 2016-11-03 LAB — CBC WITH DIFFERENTIAL/PLATELET
BASOS ABS: 0.1 10*3/uL (ref 0–0.1)
BASOS PCT: 1 %
EOS ABS: 0.1 10*3/uL (ref 0–0.7)
Eosinophils Relative: 2 %
HCT: 35.7 % (ref 35.0–47.0)
Hemoglobin: 12 g/dL (ref 12.0–16.0)
Lymphocytes Relative: 25 %
Lymphs Abs: 1.3 10*3/uL (ref 1.0–3.6)
MCH: 28.1 pg (ref 26.0–34.0)
MCHC: 33.6 g/dL (ref 32.0–36.0)
MCV: 83.7 fL (ref 80.0–100.0)
MONO ABS: 0.3 10*3/uL (ref 0.2–0.9)
MONOS PCT: 6 %
Neutro Abs: 3.4 10*3/uL (ref 1.4–6.5)
Neutrophils Relative %: 66 %
PLATELETS: 252 10*3/uL (ref 150–440)
RBC: 4.27 MIL/uL (ref 3.80–5.20)
RDW: 14.9 % — AB (ref 11.5–14.5)
WBC: 5.2 10*3/uL (ref 3.6–11.0)

## 2016-11-03 LAB — COMPREHENSIVE METABOLIC PANEL
ALT: 12 U/L — ABNORMAL LOW (ref 14–54)
AST: 17 U/L (ref 15–41)
Albumin: 4.5 g/dL (ref 3.5–5.0)
Alkaline Phosphatase: 32 U/L — ABNORMAL LOW (ref 38–126)
Anion gap: 11 (ref 5–15)
BILIRUBIN TOTAL: 1 mg/dL (ref 0.3–1.2)
BUN: 13 mg/dL (ref 6–20)
CO2: 25 mmol/L (ref 22–32)
CREATININE: 0.88 mg/dL (ref 0.44–1.00)
Calcium: 9.1 mg/dL (ref 8.9–10.3)
Chloride: 103 mmol/L (ref 101–111)
Glucose, Bld: 88 mg/dL (ref 65–99)
POTASSIUM: 3.6 mmol/L (ref 3.5–5.1)
Sodium: 139 mmol/L (ref 135–145)
TOTAL PROTEIN: 7.5 g/dL (ref 6.5–8.1)

## 2016-11-03 LAB — URINE DRUG SCREEN, QUALITATIVE (ARMC ONLY)
Amphetamines, Ur Screen: NOT DETECTED
BARBITURATES, UR SCREEN: NOT DETECTED
BENZODIAZEPINE, UR SCRN: NOT DETECTED
CANNABINOID 50 NG, UR ~~LOC~~: NOT DETECTED
COCAINE METABOLITE, UR ~~LOC~~: NOT DETECTED
MDMA (Ecstasy)Ur Screen: NOT DETECTED
Methadone Scn, Ur: NOT DETECTED
OPIATE, UR SCREEN: NOT DETECTED
Phencyclidine (PCP) Ur S: NOT DETECTED
TRICYCLIC, UR SCREEN: NOT DETECTED

## 2016-11-03 LAB — ETHANOL: Alcohol, Ethyl (B): <5 mg/dL (ref <5)

## 2016-11-03 LAB — SALICYLATE LEVEL

## 2016-11-03 LAB — ACETAMINOPHEN LEVEL

## 2016-11-03 LAB — POCT PREGNANCY, URINE: PREG TEST UR: NEGATIVE

## 2016-11-03 MED ORDER — LORAZEPAM 2 MG/ML IJ SOLN
1.0000 mg | Freq: Once | INTRAMUSCULAR | Status: AC
  Administered 2016-11-03: 1 mg via INTRAVENOUS
  Filled 2016-11-03: qty 1

## 2016-11-03 MED ORDER — LORAZEPAM 1 MG PO TABS
1.0000 mg | ORAL_TABLET | Freq: Once | ORAL | Status: AC
  Administered 2016-11-03: 1 mg via ORAL
  Filled 2016-11-03: qty 1

## 2016-11-03 NOTE — ED Provider Notes (Signed)
[facility] Emergency Department Provider Note    First MD Initiated Contact with Patient 11/03/16 1259     (approximate)  I have reviewed the triage vital signs and the nursing notes.   HISTORY  Chief Complaint Drug Overdose    HPI Maria Jones is a 31 y.o. female history anxiety on 300 mg of Wellbutrin extended release presents with accidental ingestion of 2 300 mg Wellbutrin pills. Patient states that she does not room taking the first pill and while at work took an additional 1 and then started feeling anxious. She counted up her pills and noted that there was one missing.  Denies any intent for self-harm. Denies any seizure activity. Patient does feel jittery and anxious. No nausea or vomiting.   Past Medical History:  Diagnosis Date  . Anxiety    History reviewed. No pertinent family history. Past Surgical History:  Procedure Laterality Date  . C Section     . LEEP Surgery     There are no active problems to display for this patient.     Prior to Admission medications   Medication Sig Start Date End Date Taking? Authorizing Provider  buPROPion (WELLBUTRIN XL) 300 MG 24 hr tablet Take 300 mg by mouth daily.   Yes [provider]  azithromycin (ZITHROMAX Z-PAK) 250 MG tablet Take 2 tablets (500 mg) on  Day 1,  followed by 1 tablet (250 mg) once daily on Days 2 through 5. Patient not taking: Reported on 11/03/2016 04/25/16   Evangeline Dakin, PA-C  cyclobenzaprine (FLEXERIL) 10 MG tablet Take 1 tablet (10 mg total) by mouth 3 (three) times daily as needed for muscle spasms. Patient not taking: Reported on 11/03/2016 07/03/16   Kem Boroughs B, FNP  LORazepam (ATIVAN) 0.5 MG tablet Take 0.5 mg by mouth as needed for anxiety.    [provider]  naproxen (NAPROSYN) 500 MG tablet Take 1 tablet (500 mg total) by mouth 2 (two) times daily with a meal. Patient not taking: Reported on 11/03/2016 07/03/16   Triplett, Rulon Eisenmenger B, FNP    ondansetron (ZOFRAN ODT) 4 MG disintegrating tablet Take 1 tablet (4 mg total) by mouth every 8 (eight) hours as needed for nausea or vomiting. Patient not taking: Reported on 11/03/2016 07/04/15   Renford Dills, NP  oseltamivir (TAMIFLU) 75 MG capsule Take 1 capsule (75 mg total) by mouth every 12 (twelve) hours. Patient not taking: Reported on 11/03/2016 06/02/16   Renford Dills, NP    Allergies Antihistamines, chlorpheniramine-type    Social History Social History  Substance Use Topics  . Smoking status: Never Smoker  . Smokeless tobacco: Never Used  . Alcohol use No    Review of Systems Patient denies headaches, rhinorrhea, blurry vision, numbness, shortness of breath, chest pain, edema, cough, abdominal pain, nausea, vomiting, diarrhea, dysuria, fevers, rashes or hallucinations unless otherwise stated above in HPI. ____________________________________________   PHYSICAL EXAM:  VITAL SIGNS: Vitals:   11/03/16 1247 11/03/16 1402  BP: 125/85 126/79  Pulse: 81 73  Resp: (!) 25 17  Temp: 98.1 F (36.7 C)     Constitutional: Alert and oriented. Very anxious appearing but in NAD Eyes: Conjunctivae are normal.  Head: Atraumatic. Nose: No congestion/rhinnorhea. Mouth/Throat: Mucous membranes are moist.   Neck: No stridor. Painless ROM.  Cardiovascular: Normal rate, regular rhythm. Grossly normal heart sounds.  Good peripheral circulation. Respiratory: Normal respiratory effort.  No retractions. Lungs CTAB. Gastrointestinal: Soft and nontender. No distention. No abdominal bruits.  No CVA tenderness. Musculoskeletal: No lower extremity tenderness nor edema.  No joint effusions. Neurologic:  Normal speech and language. No gross focal neurologic deficits are appreciated. No facial droop Skin:  Skin is warm, dry and intact. No rash noted. Psychiatric: Mood and affect are normal. Speech and behavior are normal.  ____________________________________________   LABS (all labs  ordered are listed, but only abnormal results are displayed)  Results for orders placed or performed during the hospital encounter of 11/03/16 (from the past 24 hour(s))  Comprehensive metabolic panel     Status: Abnormal   Collection Time: 11/03/16 12:49 PM  Result Value Ref Range   Sodium 139 135 - 145 mmol/L   Potassium 3.6 3.5 - 5.1 mmol/L   Chloride 103 101 - 111 mmol/L   CO2 25 22 - 32 mmol/L   Glucose, Bld 88 65 - 99 mg/dL   BUN 13 6 - 20 mg/dL   Creatinine, Ser 4.090.88 0.44 - 1.00 mg/dL   Calcium 9.1 8.9 - 81.110.3 mg/dL   Total Protein 7.5 6.5 - 8.1 g/dL   Albumin 4.5 3.5 - 5.0 g/dL   AST 17 15 - 41 U/L   ALT 12 (L) 14 - 54 U/L   Alkaline Phosphatase 32 (L) 38 - 126 U/L   Total Bilirubin 1.0 0.3 - 1.2 mg/dL   GFR calc non Af Amer >60 >60 mL/min   GFR calc Af Amer >60 >60 mL/min   Anion gap 11 5 - 15  Salicylate level     Status: None   Collection Time: 11/03/16 12:49 PM  Result Value Ref Range   Salicylate Lvl <7.0 2.8 - 30.0 mg/dL  Acetaminophen level     Status: Abnormal   Collection Time: 11/03/16 12:49 PM  Result Value Ref Range   Acetaminophen (Tylenol), Serum <10 (L) 10 - 30 ug/mL  Ethanol     Status: None   Collection Time: 11/03/16 12:49 PM  Result Value Ref Range   Alcohol, Ethyl (B) <5 <5 mg/dL  CBC WITH DIFFERENTIAL     Status: Abnormal   Collection Time: 11/03/16 12:49 PM  Result Value Ref Range   WBC 5.2 3.6 - 11.0 K/uL   RBC 4.27 3.80 - 5.20 MIL/uL   Hemoglobin 12.0 12.0 - 16.0 g/dL   HCT 91.435.7 78.235.0 - 95.647.0 %   MCV 83.7 80.0 - 100.0 fL   MCH 28.1 26.0 - 34.0 pg   MCHC 33.6 32.0 - 36.0 g/dL   RDW 21.314.9 (H) 08.611.5 - 57.814.5 %   Platelets 252 150 - 440 K/uL   Neutrophils Relative % 66 %   Neutro Abs 3.4 1.4 - 6.5 K/uL   Lymphocytes Relative 25 %   Lymphs Abs 1.3 1.0 - 3.6 K/uL   Monocytes Relative 6 %   Monocytes Absolute 0.3 0.2 - 0.9 K/uL   Eosinophils Relative 2 %   Eosinophils Absolute 0.1 0 - 0.7 K/uL   Basophils Relative 1 %   Basophils Absolute 0.1  0 - 0.1 K/uL  Urine Drug Screen, Qualitative     Status: None   Collection Time: 11/03/16  1:56 PM  Result Value Ref Range   Tricyclic, Ur Screen NONE DETECTED NONE DETECTED   Amphetamines, Ur Screen NONE DETECTED NONE DETECTED   MDMA (Ecstasy)Ur Screen NONE DETECTED NONE DETECTED   Cocaine Metabolite,Ur [place] NONE DETECTED NONE DETECTED   Opiate, Ur Screen NONE DETECTED NONE DETECTED   Phencyclidine (PCP) Ur S NONE DETECTED NONE DETECTED   Cannabinoid 50  Ng, Ur [place] NONE DETECTED NONE DETECTED   Barbiturates, Ur Screen NONE DETECTED NONE DETECTED   Benzodiazepine, Ur Scrn NONE DETECTED NONE DETECTED   Methadone Scn, Ur NONE DETECTED NONE DETECTED  Pregnancy, urine POC     Status: None   Collection Time: 11/03/16  2:07 PM  Result Value Ref Range   Preg Test, Ur NEGATIVE NEGATIVE   ____________________________________________  EKG My review and personal interpretation at Time: 13:00   Indication: overdose  Rate: 70  Rhythm: sinus Axis: normal intervals,  Other: normal qrs,n o prolonged qt ____________________________________________  RADIOLOGY   ____________________________________________   PROCEDURES  Procedure(s) performed:  Procedures    Critical Care performed: no ____________________________________________   INITIAL IMPRESSION / ASSESSMENT AND PLAN / ED COURSE  Pertinent labs & imaging results that were available during my care of the patient were reviewed by me and considered in my medical decision making (see chart for details).  DDX: overdose, accidental overdose, panic attack  Maria Jones is a 31 y.o. who presents to the ED with accidental overdose of a total of 6 mg of Wellbutrin.  Patient is AFVSS in ED. Exam as above. Given current presentation have considered the above differential.  She has very anxious appearing. EKG shows no evidence of acute dysrhythmia. She is otherwise well-appearing. I spoke with poison control who states that given the  ingestion of only 600 mg they typically would feel comfortable observing these patients at home. While patient has been in the ER due to her anxiety state patient was given Ativan. She has no evidence of seizure-like activity. We'll check blood work to evaluate for any evidence of acute abnormality.  The patient will be placed on continuous pulse oximetry and telemetry for monitoring.  Laboratory evaluation will be sent to evaluate for the above complaints.     Clinical Course as of Nov 04 1523  Wed Nov 03, 2016  1519 Patient remains hemodynamically stable.  Do feel patient is stable for DC home with additional period of observation as an outpatient.  Have discussed with the patient and available family all diagnostics and treatments performed thus far and all questions were answered to the best of my ability. The patient demonstrates understanding and agreement with plan.   [PR]    Clinical Course User Index [PR] Willy Eddy, MD     ____________________________________________   FINAL CLINICAL IMPRESSION(S) / ED DIAGNOSES  Final diagnoses:  Bupropion overdose, accidental or unintentional, initial encounter      NEW MEDICATIONS STARTED DURING THIS VISIT:  New Prescriptions   No medications on file     Note:  This document was prepared using Dragon voice recognition software and may include unintentional dictation errors.    Willy Eddy, MD 11/03/16 1525

## 2016-11-03 NOTE — ED Triage Notes (Signed)
Pt states accidentally OD on Wellbutrin at work. States took 600mg  XR. Pt took first pill at 10:40, second pill at 11. Pt states she doesn't remember taking first pill. Pt talking rapidly.   Pt appears anxious. States "I don't know why they put me in the lobby"

## 2016-11-03 NOTE — ED Notes (Signed)
Pt to ed with c/o accidentally taking an extra Wellbutrin at work today. States took 600mg  XR. Pt took first pill at 10:40, second pill at 11. Pt states she doesn't remember taking first pill. Pt talking rapidly and reports feeling jittery.  Pt alert and oriented. Skin warm and dry,  Hr 76 on cm,  Family at bedside. sats 100%.  SR on monitor.

## 2018-02-12 ENCOUNTER — Other Ambulatory Visit: Payer: Self-pay

## 2018-02-12 ENCOUNTER — Emergency Department
Admission: EM | Admit: 2018-02-12 | Discharge: 2018-02-12 | Disposition: A | Discharge status: Home / Self Care | Payer: Medicaid Other | Attending: Emergency Medicine | Admitting: Emergency Medicine

## 2018-02-12 DIAGNOSIS — Z79899 Other long term (current) drug therapy: Secondary | ICD-10-CM | POA: Diagnosis not present

## 2018-02-12 DIAGNOSIS — W461XXA Contact with contaminated hypodermic needle, initial encounter: Secondary | ICD-10-CM | POA: Insufficient documentation

## 2018-02-12 DIAGNOSIS — Z7721 Contact with and (suspected) exposure to potentially hazardous body fluids: Secondary | ICD-10-CM | POA: Diagnosis present

## 2018-02-12 LAB — POCT PREGNANCY, URINE: PREG TEST UR: NEGATIVE

## 2018-02-12 NOTE — ED Triage Notes (Signed)
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. Dirty needle stick to inner thigh.   A&O x4. Ambulatory.

## 2018-02-12 NOTE — ED Provider Notes (Signed)
[facility] Emergency Department Provider Note  ____________________________________________  Time seen: Approximately 3:03 PM  I have reviewed the triage vital signs and the nursing notes.   HISTORY  Chief Complaint Body Fluid Exposure    HPI Maria Jones is a 32 y.o. female that presents to the emergency department for evaluation after dirty needle stick.  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  She states that needle poked the outside of her skin on the back of her right thigh.  She did not bleed after stick.  Individual whose blood patient was exposed to was brought to [facility] emergency department.   Past Medical History:  Diagnosis Date  . Anxiety     There are no active problems to display for this patient.   Past Surgical History:  Procedure Laterality Date  . C Section     . LEEP Surgery      Prior to Admission medications   Medication Sig Start Date End Date Taking? Authorizing Provider  azithromycin (ZITHROMAX Z-PAK) 250 MG tablet Take 2 tablets (500 mg) on  Day 1,  followed by 1 tablet (250 mg) once daily on Days 2 through 5. Patient not taking: Reported on 11/03/2016 04/25/16   Evangeline Dakin, PA-C  buPROPion (WELLBUTRIN XL) 300 MG 24 hr tablet Take 300 mg by mouth daily.    [provider]  cyclobenzaprine (FLEXERIL) 10 MG tablet Take 1 tablet (10 mg total) by mouth 3 (three) times daily as needed for muscle spasms. Patient not taking: Reported on 11/03/2016 07/03/16   Kem Boroughs B, FNP  LORazepam (ATIVAN) 0.5 MG tablet Take 0.5 mg by mouth as needed for anxiety.    [provider]  naproxen (NAPROSYN) 500 MG tablet Take 1 tablet (500 mg total) by mouth 2 (two) times daily with a meal. Patient not taking: Reported on 11/03/2016 07/03/16   Triplett, Rulon Eisenmenger B, FNP  ondansetron (ZOFRAN ODT) 4 MG disintegrating tablet Take 1 tablet (4 mg total) by mouth every 8 (eight)  hours as needed for nausea or vomiting. Patient not taking: Reported on 11/03/2016 07/04/15   Renford Dills, NP  oseltamivir (TAMIFLU) 75 MG capsule Take 1 capsule (75 mg total) by mouth every 12 (twelve) hours. Patient not taking: Reported on 11/03/2016 06/02/16   Renford Dills, NP    Allergies Antihistamines, chlorpheniramine-type  History reviewed. No pertinent family history.  Social History Social History   Tobacco Use  . Smoking status: Never Smoker  . Smokeless tobacco: Never Used  Substance Use Topics  . Alcohol use: No  . Drug use: No     Review of Systems  Gastrointestinal: No nausea, no vomiting.  Musculoskeletal: Negative for musculoskeletal pain. Skin: Positive for puncture.   ____________________________________________   PHYSICAL EXAM:  VITAL SIGNS: ED Triage Vitals  Enc Vitals Group     BP 02/12/18 1432 140/89     Pulse Rate 02/12/18 1432 (!) 104     Resp 02/12/18 1432 18     Temp 02/12/18 1432 98.1 F (36.7 C)     Temp Source 02/12/18 1432 Oral     SpO2 02/12/18 1432 96 %     Weight 02/12/18 1435 123 lb (55.8 kg)     Height 02/12/18 1435 5\' 6"  (1.676 m)     Head Circumference --      Peak Flow --      Pain Score 02/12/18 1432 0  Pain Loc --      Pain Edu? --      Excl. in GC? --      Constitutional: Alert and oriented. Well appearing and in no acute distress. Eyes: Conjunctivae are normal. PERRL. EOMI. Head: Atraumatic. ENT:      Ears:      Nose: No congestion/rhinnorhea.      Mouth/Throat: Mucous membranes are moist.  Neck: No stridor.   Cardiovascular: Normal rate, regular rhythm.  Good peripheral circulation. Respiratory: Normal respiratory effort without tachypnea or retractions. Lungs CTAB. Good air entry to the bases with no decreased or absent breath sounds. Musculoskeletal: Full range of motion to all extremities. No gross deformities appreciated. Neurologic:  Normal speech and language. No gross focal neurologic deficits  are appreciated.  Skin:  Skin is warm, dry and intact. No rash noted. Psychiatric: Mood and affect are normal. Speech and behavior are normal. Patient exhibits appropriate insight and judgement.   ____________________________________________   LABS (all labs ordered are listed, but only abnormal results are displayed)  Labs Reviewed  CBC WITH DIFFERENTIAL/PLATELET  COMPREHENSIVE METABOLIC PANEL  POCT PREGNANCY, URINE  POC URINE PREG, ED   ____________________________________________  EKG   ____________________________________________  RADIOLOGY  No results found.  ____________________________________________    PROCEDURES  Procedure(s) performed:    Procedures    Medications - No data to display   ____________________________________________   INITIAL IMPRESSION / ASSESSMENT AND PLAN / ED COURSE  Pertinent labs & imaging results that were available during my care of the patient were reviewed by me and considered in my medical decision making (see chart for details).  Review of the [place] CSRS was performed in accordance of the [facility] prior to dispensing any controlled drugs.     Patient presented to emergency department for evaluation after dirty needle stick.  Maria Jones whose blood patient was exposed to is in the emergency department on main side ED.  Individual is a patient of Dr. Mayford Knife.  Dr. Mayford Knife was consulted and ordered HIV and hepatitis panel on patient.  Dr. Mayford Knife recommends discharge of Maria Jones after HIV result of Maria Jones.  Maria Jones tested negative for HIV.  Hepatitis panel is pending.  Note was sent to Maria Jones to call patient if hepatitis panel comes back positive.  Patient is agreeable with plan.   Patient is given ED precautions to return to the ED for any worsening or new symptoms.     ____________________________________________  FINAL CLINICAL IMPRESSION(S) / ED DIAGNOSES  Final diagnoses:  None       NEW MEDICATIONS STARTED DURING THIS VISIT:  ED Discharge Orders    None          This chart was dictated using voice recognition software/Dragon. Despite best efforts to proofread, errors can occur which can change the meaning. Any change was purely unintentional.    Enid Derry, PA-C 02/12/18 2301    Emily Filbert, MD 02/17/18 1357

## 2018-02-12 NOTE — ED Notes (Signed)
Explained to pt process of obtaining urine and blood work for body fluid exposure. Pt verbalized understanding. Pt signed body fluid exposure paperwork in case of need to draw blood or treat with prophylactic medicaitons.

## 2018-02-12 NOTE — Discharge Instructions (Signed)
Patient whose blood you came in contact with from needlestick tested negative for HIV.  Hepatitis testing is pending.  You will be notified if test comes back positive.

## 2018-02-12 NOTE — ED Notes (Signed)
Needle injury occurred on right posterior upper thigh. Patient states she cleaned the area with soap and water after the injury.

## 2018-06-30 ENCOUNTER — Ambulatory Visit
Admission: EM | Admit: 2018-06-30 | Discharge: 2018-06-30 | Disposition: A | Discharge status: Home / Self Care | Payer: Medicaid Other | Attending: Internal Medicine | Admitting: Internal Medicine

## 2018-06-30 ENCOUNTER — Other Ambulatory Visit: Payer: Self-pay

## 2018-06-30 ENCOUNTER — Encounter: Payer: Self-pay | Admitting: Gynecology

## 2018-06-30 DIAGNOSIS — J029 Acute pharyngitis, unspecified: Secondary | ICD-10-CM | POA: Insufficient documentation

## 2018-06-30 HISTORY — DX: Attention-deficit hyperactivity disorder, unspecified type: F90.9

## 2018-06-30 LAB — RAPID INFLUENZA A&B ANTIGENS
Influenza A (ARMC): NEGATIVE
Influenza B (ARMC): NEGATIVE

## 2018-06-30 NOTE — ED Triage Notes (Signed)
Patient c/o low grade fever/ fatigue / headache . Per patient no cough.

## 2018-06-30 NOTE — ED Provider Notes (Addendum)
[facility]    CSN: [id number] Arrival date & time: 06/30/18  1334     History   Chief Complaint No chief complaint on file.   HPI Maria Jones is a 33 y.o. 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  Patient symptoms started fairly rapidly yesterday and is worsened today.  Complaint of sore throat yesterday without initial rhinorrhea.  This morning she started having nasal congestion and worsening of a sore throat.  She subsequently had fever of 100.4 and worsening fatigue and hence the visit to be urgent care.  She denies any nausea, vomiting or diarrhea.  No sick contacts.   Past Medical History:  Diagnosis Date  . ADHD   . Anxiety     There are no active problems to display for this patient.   Past Surgical History:  Procedure Laterality Date  . C Section     . LEEP Surgery      OB History   No obstetric history on file.      Home Medications    Prior to Admission medications   Medication Sig Start Date End Date Taking? Authorizing Provider  lisdexamfetamine (VYVANSE) 60 MG capsule Take by mouth.   Yes [provider]  VYVANSE 70 MG capsule TK 1 C PO QAM FOR FOCUS 06/03/18  Yes [provider]  azithromycin (ZITHROMAX Z-PAK) 250 MG tablet Take 2 tablets (500 mg) on  Day 1,  followed by 1 tablet (250 mg) once daily on Days 2 through 5. Patient not taking: Reported on 11/03/2016 04/25/16   Evangeline Dakin, PA-C  buPROPion (WELLBUTRIN XL) 300 MG 24 hr tablet Take 300 mg by mouth daily.    [provider]  cyclobenzaprine (FLEXERIL) 10 MG tablet Take 1 tablet (10 mg total) by mouth 3 (three) times daily as needed for muscle spasms. Patient not taking: Reported on 11/03/2016 07/03/16   Kem Boroughs B, FNP  LORazepam (ATIVAN) 0.5 MG tablet Take 0.5 mg by mouth as needed for  anxiety.    [provider]  naproxen (NAPROSYN) 500 MG tablet Take 1 tablet (500 mg total) by mouth 2 (two) times daily with a meal. Patient not taking: Reported on 11/03/2016 07/03/16   Triplett, Rulon Eisenmenger B, FNP  ondansetron (ZOFRAN ODT) 4 MG disintegrating tablet Take 1 tablet (4 mg total) by mouth every 8 (eight) hours as needed for nausea or vomiting. Patient not taking: Reported on 11/03/2016 07/04/15   Renford Dills, NP  oseltamivir (TAMIFLU) 75 MG capsule Take 1 capsule (75 mg total) by mouth every 12 (twelve) hours. Patient not taking: Reported on 11/03/2016 06/02/16   Renford Dills, NP    Family History Family History  Problem Relation Age of Onset  . Cirrhosis Father   . Hepatitis C Father     Social History Social History   Tobacco Use  . Smoking status: Never Smoker  . Smokeless tobacco: Never Used  Substance Use Topics  . Alcohol use: No  . Drug use: No     Allergies   Sertraline and Antihistamines, chlorpheniramine-type   Review of Systems Review of Systems  Constitutional: Positive for activity change and fatigue. Negative for appetite change.  HENT: Positive for congestion and sore throat. Negative for ear discharge, ear pain, mouth sores, postnasal drip, sinus pressure and sinus pain.   Eyes: Negative  for pain, discharge and itching.  Respiratory: Negative for chest tightness, shortness of breath and wheezing.   Cardiovascular: Negative for chest pain and palpitations.  Gastrointestinal: Negative for abdominal distention and abdominal pain.  Genitourinary: Negative for dysuria, frequency and urgency.  Musculoskeletal: Negative for arthralgias and myalgias.  Neurological: Negative for dizziness, weakness and light-headedness.     Physical Exam Triage Vital Signs ED Triage Vitals  Enc Vitals Group     BP 06/30/18 1344 119/81     Pulse Rate 06/30/18 1344 91     Resp 06/30/18 1344 16     Temp 06/30/18 1344 (!) 97.5 F (36.4 C)     Temp Source  06/30/18 1344 Oral     SpO2 06/30/18 1344 100 %     Weight 06/30/18 1346 135 lb (61.2 kg)     Height 06/30/18 1346 5\' 6"  (1.676 m)     Head Circumference --      Peak Flow --      Pain Score 06/30/18 1346 4     Pain Loc --      Pain Edu? --      Excl. in GC? --    No data found.  Updated Vital Signs BP 119/81 (BP Location: Left Arm)   Pulse 91   Temp (!) 97.5 F (36.4 C) (Oral)   Resp 16   Ht 5\' 6"  (1.676 m)   Wt 61.2 kg   LMP 06/09/2018   SpO2 100%   BMI 21.79 kg/m   Visual Acuity Right Eye Distance:   Left Eye Distance:   Bilateral Distance:    Right Eye Near:   Left Eye Near:    Bilateral Near:     Physical Exam   UC Treatments / Results  Labs (all labs ordered are listed, but only abnormal results are displayed) Labs Reviewed  RAPID INFLUENZA A&B ANTIGENS ([facility] ONLY)    EKG None  Radiology No results found.  Procedures Procedures (including critical care time)  Medications Ordered in UC Medications - No data to display  Initial Impression / Assessment and Plan / UC Course  I have reviewed the triage vital signs and the nursing notes.  Pertinent labs & imaging results that were available during my care of the patient were reviewed by me and considered in my medical decision making (see chart for details).     1.  Viral syndrome (possibly infectious mononucleosis): Rapid flu test is negative Ideally on order rapid flu test on this patient but she lives at home with an immunocompromised child. Supportive care with Tylenol/Motrin Encourage oral fluid intake Patient has no splenomegaly Final Clinical Impressions(s) / UC Diagnoses   Final diagnoses:  None   Discharge Instructions   None    ED Prescriptions    None     Controlled Substance Prescriptions [place] Controlled Substance Registry consulted? No   Merrilee Jansky, MD 06/30/18 1420    Merrilee Jansky, MD 06/30/18 [phone]

## 2018-07-01 ENCOUNTER — Ambulatory Visit
Admission: EM | Admit: 2018-07-01 | Discharge: 2018-07-01 | Disposition: A | Discharge status: Home / Self Care | Payer: Medicaid Other | Attending: Family Medicine | Admitting: Family Medicine

## 2018-07-01 ENCOUNTER — Other Ambulatory Visit: Payer: Self-pay

## 2018-07-01 ENCOUNTER — Ambulatory Visit
Admission: EM | Admit: 2018-07-01 | Discharge: 2018-07-01 | Discharge status: Absent without leave | Payer: Medicaid Other

## 2018-07-01 DIAGNOSIS — H6983 Other specified disorders of Eustachian tube, bilateral: Secondary | ICD-10-CM | POA: Diagnosis not present

## 2018-07-01 DIAGNOSIS — J069 Acute upper respiratory infection, unspecified: Secondary | ICD-10-CM

## 2018-07-01 DIAGNOSIS — H6993 Unspecified Eustachian tube disorder, bilateral: Secondary | ICD-10-CM

## 2018-07-01 MED ORDER — GUAIFENESIN-CODEINE 100-10 MG/5ML PO SYRP: 5.0000 mL | ORAL_SOLUTION | Freq: Three times a day (TID) | ORAL | 0 refills | Status: DC | PRN

## 2018-07-01 MED ORDER — BENZONATATE 200 MG PO CAPS: ORAL_CAPSULE | ORAL | 0 refills | Status: DC

## 2018-07-01 MED ORDER — BENZONATATE 200 MG PO CAPS: ORAL_CAPSULE | ORAL | 0 refills | Status: AC

## 2018-07-01 MED ORDER — GUAIFENESIN-CODEINE 100-10 MG/5ML PO SYRP: 5.0000 mL | ORAL_SOLUTION | Freq: Three times a day (TID) | ORAL | 0 refills | Status: AC | PRN

## 2018-07-01 NOTE — ED Triage Notes (Addendum)
Pt states her ears are full, has congestion and pressure, bodyaches. No fever. Had negative flu and negative strep test yesterday. Developed cough today

## 2018-07-01 NOTE — Discharge Instructions (Addendum)
Use Chlor-Trimeton according to instructions on bottle for decongestion.  Flonase nasal spray  2 sprays each nostril once daily for 2 to 3 weeks.  Use Cheratussin at nighttime only, to help with sleep

## 2018-07-01 NOTE — ED Provider Notes (Signed)
[facility]    CSN: [id number] Arrival date & time: 07/01/18  1451     History   Chief Complaint Chief Complaint  Patient presents with  . Nasal Congestion    HPI Maria Jones is a 33 y.o. female.   HPI  -year-old female returns today after being seen yesterday for a viral illness.  States that today her ears are full she has congestion and pressure and body aches.  She started having cough today that is nonproductive.  She continues to have no fever.  Flu and strep test were negative yesterday.  Vital signs today are normal.  States that the congestion in her head and the pressure in her ears as well as loss of hearing in her left ear is what seems to be bothering her the most.         Past Medical History:  Diagnosis Date  . ADHD   . Anxiety     There are no active problems to display for this patient.   Past Surgical History:  Procedure Laterality Date  . C Section     . LEEP Surgery      OB History   No obstetric history on file.      Home Medications    Prior to Admission medications   Medication Sig Start Date End Date Taking? Authorizing Provider  benzonatate (TESSALON) 200 MG capsule Take one cap TID PRN cough 07/01/18   Ovid Curd P, PA-C  guaiFENesin-codeine (CHERATUSSIN AC) 100-10 MG/5ML syrup Take 5 mLs by mouth 3 (three) times daily as needed for cough. 07/01/18   Lutricia Feil, PA-C  lisdexamfetamine (VYVANSE) 60 MG capsule Take by mouth.    [provider]  VYVANSE 70 MG capsule TK 1 C PO QAM FOR FOCUS 06/03/18   [provider]    Family History Family History  Problem Relation Age of Onset  . Cirrhosis Father   . Hepatitis C Father     Social History Social History   Tobacco Use  . Smoking status: Never Smoker  . Smokeless tobacco: Never Used  Substance Use Topics  . Alcohol use: No  . Drug use: No     Allergies   Sertraline and Antihistamines, chlorpheniramine-type   Review of  Systems Review of Systems  Constitutional: Positive for activity change and chills. Negative for appetite change, diaphoresis, fatigue and fever.  HENT: Positive for congestion, ear discharge, ear pain, postnasal drip, rhinorrhea, sinus pressure, sinus pain and sore throat.   Respiratory: Positive for cough.   All other systems reviewed and are negative.    Physical Exam Triage Vital Signs ED Triage Vitals  Enc Vitals Group     BP 07/01/18 1503 131/86     Pulse Rate 07/01/18 1503 96     Resp 07/01/18 1503 16     Temp 07/01/18 1503 97.9 F (36.6 C)     Temp Source 07/01/18 1503 Oral     SpO2 07/01/18 1503 100 %     Weight 07/01/18 1504 134 lb 14.7 oz (61.2 kg)     Height 07/01/18 1504  (1.676 m)     Head Circumference --      Peak Flow --      Pain Score 07/01/18 1504 4     Pain Loc --      Pain Edu? --      Excl. in GC? --    No data found.  Updated Vital Signs BP 131/86 (BP  Location: Left Arm)   Pulse 96   Temp 97.9 F (36.6 C) (Oral)   Resp 16   Ht 5\' 6"  (1.676 m)   Wt 134 lb 14.7 oz (61.2 kg)   LMP 06/09/2018   SpO2 100%   BMI 21.78 kg/m   Visual Acuity Right Eye Distance:   Left Eye Distance:   Bilateral Distance:    Right Eye Near:   Left Eye Near:    Bilateral Near:     Physical Exam Vitals signs and nursing note reviewed.  Constitutional:      General: She is not in acute distress.    Appearance: Normal appearance. She is normal weight. She is not ill-appearing, toxic-appearing or diaphoretic.  HENT:     Head: Normocephalic and atraumatic.     Right Ear: Tympanic membrane, ear canal and external ear normal.     Left Ear: Tympanic membrane, ear canal and external ear normal.     Nose: Congestion and rhinorrhea present.     Mouth/Throat:     Mouth: Mucous membranes are moist.     Pharynx: Oropharynx is clear. No oropharyngeal exudate or posterior oropharyngeal erythema.  Eyes:     General:        Right eye: No discharge.        Left eye:  No discharge.     Conjunctiva/sclera: Conjunctivae normal.  Neck:     Musculoskeletal: Normal range of motion and neck supple.  Pulmonary:     Effort: Pulmonary effort is normal.     Breath sounds: Normal breath sounds.  Musculoskeletal: Normal range of motion.  Lymphadenopathy:     Cervical: No cervical adenopathy.  Skin:    General: Skin is warm and dry.  Neurological:     General: No focal deficit present.     Mental Status: She is alert and oriented to person, place, and time.  Psychiatric:        Mood and Affect: Mood normal.        Behavior: Behavior normal.        Thought Content: Thought content normal.        Judgment: Judgment normal.      UC Treatments / Results  Labs (all labs ordered are listed, but only abnormal results are displayed) Labs Reviewed - No data to display  EKG None  Radiology No results found.  Procedures Procedures (including critical care time)  Medications Ordered in UC Medications - No data to display  Initial Impression / Assessment and Plan / UC Course  I have reviewed the triage vital signs and the nursing notes.  Pertinent labs & imaging results that were available during my care of the patient were reviewed by me and considered in my medical decision making (see chart for details).   Assured the patient that this is the same as yesterday and is a viral illness.  Not appear to be flu like.  I will provide her with Cheratussin at nighttime and Tessalon Perles through the daytime.  The congestion in her head she should use Chlor-Trimeton OTC HBP. Flonase nasal spray for the eustachian tube dysfunction.  If she is not improving recommend she follow-up with her primary care physician.   Final Clinical Impressions(s) / UC Diagnoses   Final diagnoses:  Upper respiratory tract infection, unspecified type  Eustachian tube dysfunction, bilateral     Discharge Instructions     Use Chlor-Trimeton according to instructions on bottle  for decongestion.  Flonase nasal spray  2  sprays each nostril once daily for 2 to 3 weeks.  Use Cheratussin at nighttime only, to help with sleep   ED Prescriptions    Medication Sig Dispense Auth. Provider   guaiFENesin-codeine (CHERATUSSIN AC) 100-10 MG/5ML syrup Take 5 mLs by mouth 3 (three) times daily as needed for cough. 120 mL Ovid Curd P, PA-C   benzonatate (TESSALON) 200 MG capsule Take one cap TID PRN cough 30 capsule Lutricia Feil, PA-C     Controlled Substance Prescriptions  Controlled Substance Registry consulted? Not Applicable   Lutricia Feil, PA-C 07/01/18 1639

## 2018-11-11 ENCOUNTER — Emergency Department
Admission: EM | Admit: 2018-11-11 | Discharge: 2018-11-11 | Disposition: A | Discharge status: Home / Self Care | Payer: No Typology Code available for payment source | Attending: Emergency Medicine | Admitting: Emergency Medicine

## 2018-11-11 ENCOUNTER — Encounter: Payer: Self-pay | Admitting: Emergency Medicine

## 2018-11-11 ENCOUNTER — Other Ambulatory Visit: Payer: Self-pay

## 2018-11-11 ENCOUNTER — Emergency Department: Payer: No Typology Code available for payment source

## 2018-11-11 DIAGNOSIS — R05 Cough: Secondary | ICD-10-CM | POA: Diagnosis present

## 2018-11-11 DIAGNOSIS — U071 COVID-19: Secondary | ICD-10-CM | POA: Insufficient documentation

## 2018-11-11 DIAGNOSIS — Z3202 Encounter for pregnancy test, result negative: Secondary | ICD-10-CM | POA: Insufficient documentation

## 2018-11-11 DIAGNOSIS — B349 Viral infection, unspecified: Secondary | ICD-10-CM

## 2018-11-11 DIAGNOSIS — R0602 Shortness of breath: Secondary | ICD-10-CM

## 2018-11-11 LAB — BASIC METABOLIC PANEL
Anion gap: 8 (ref 5–15)
BUN: 8 mg/dL (ref 6–20)
CO2: 25 mmol/L (ref 22–32)
Calcium: 9.1 mg/dL (ref 8.9–10.3)
Chloride: 104 mmol/L (ref 98–111)
Creatinine, Ser: 0.74 mg/dL (ref 0.44–1.00)
GFR calc Af Amer: >60 mL/min (ref >60)
GFR calc non Af Amer: >60 mL/min (ref >60)
Glucose, Bld: 92 mg/dL (ref 70–99)
Potassium: 3.5 mmol/L (ref 3.5–5.1)
Sodium: 137 mmol/L (ref 135–145)

## 2018-11-11 LAB — URINALYSIS, COMPLETE (UACMP) WITH MICROSCOPIC
Bilirubin Urine: NEGATIVE
Glucose, UA: NEGATIVE mg/dL
Hgb urine dipstick: NEGATIVE
Ketones, ur: NEGATIVE mg/dL
Leukocytes,Ua: NEGATIVE
Nitrite: NEGATIVE
Protein, ur: NEGATIVE mg/dL
Specific Gravity, Urine: 1.006 (ref 1.005–1.030)
pH: 6 (ref 5.0–8.0)

## 2018-11-11 LAB — CBC WITH DIFFERENTIAL/PLATELET
Abs Immature Granulocytes: 0.01 10*3/uL (ref 0.00–0.07)
Basophils Absolute: 0.1 10*3/uL (ref 0.0–0.1)
Basophils Relative: 1 %
Eosinophils Absolute: 0.1 10*3/uL (ref 0.0–0.5)
Eosinophils Relative: 3 %
HCT: 34.1 % — ABNORMAL LOW (ref 36.0–46.0)
Hemoglobin: 10.4 g/dL — ABNORMAL LOW (ref 12.0–15.0)
Immature Granulocytes: 0 %
Lymphocytes Relative: 20 %
Lymphs Abs: 0.8 10*3/uL (ref 0.7–4.0)
MCH: 23.9 pg — ABNORMAL LOW (ref 26.0–34.0)
MCHC: 30.5 g/dL (ref 30.0–36.0)
MCV: 78.4 fL — ABNORMAL LOW (ref 80.0–100.0)
Monocytes Absolute: 0.6 10*3/uL (ref 0.1–1.0)
Monocytes Relative: 16 %
Neutro Abs: 2.3 10*3/uL (ref 1.7–7.7)
Neutrophils Relative %: 60 %
Platelets: 250 10*3/uL (ref 150–400)
RBC: 4.35 MIL/uL (ref 3.87–5.11)
RDW: 14.4 % (ref 11.5–15.5)
WBC: 3.9 10*3/uL — ABNORMAL LOW (ref 4.0–10.5)
nRBC: 0 % (ref 0.0–0.2)

## 2018-11-11 LAB — FIBRIN DERIVATIVES D-DIMER (ARMC ONLY): Fibrin derivatives D-dimer (ARMC): 297.1 ng/mL (FEU) (ref 0.00–499.00)

## 2018-11-11 LAB — TROPONIN I (HIGH SENSITIVITY): Troponin I (High Sensitivity): <2 ng/L (ref <18)

## 2018-11-11 LAB — PREGNANCY, URINE: Preg Test, Ur: NEGATIVE

## 2018-11-11 MED ORDER — PREDNISONE 10 MG (21) PO TBPK: ORAL_TABLET | ORAL | 0 refills | Status: DC

## 2018-11-11 MED ORDER — ALBUTEROL SULFATE HFA 108 (90 BASE) MCG/ACT IN AERS: 2.0000 | INHALATION_SPRAY | RESPIRATORY_TRACT | 1 refills | Status: DC | PRN

## 2018-11-11 MED ORDER — PREDNISOLONE SODIUM PHOSPHATE 15 MG/5ML PO SOLN: 30.0000 mg | Freq: Two times a day (BID) | ORAL | 0 refills | Status: AC

## 2018-11-11 NOTE — Discharge Instructions (Signed)
Quarantine yourself until your test is back. If negative, stay quarantined until you are fever free for 3 days without tylenol or ibuprofen. If your test is positive, quarantine yourself for 2 weeks.  Return to the ER for symptoms that change or worsen if unable to schedule an appointment with primary care.

## 2018-11-11 NOTE — ED Triage Notes (Addendum)
Cough x 4 days, has exposed to COVID. Patient states has had fever up to 102 oral at home. 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

## 2018-11-11 NOTE — ED Provider Notes (Signed)
[facility] Emergency Department Provider Note  ____________________________________________  Time seen: Approximately 1:42 PM  I have reviewed the triage vital signs and the nursing notes.   HISTORY  Chief Complaint Cough   HPI Maria Jones is a [age] y.o. female who presents to the emergency department for treatment and evaluation of cough, chest pain, headache, and fever.  Cough started about 3 days ago which was followed by midsternal chest pain that radiates under both shoulder blades.  Fever started today.  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  She does state, however that she has worn her PPE religiously.  Past Medical History:  Diagnosis Date  . ADHD   . Anxiety     There are no active problems to display for this patient.   Past Surgical History:  Procedure Laterality Date  . C Section     . LEEP Surgery      Prior to Admission medications   Medication Sig Start Date End Date Taking? Authorizing Provider  albuterol (VENTOLIN HFA) 108 (90 Base) MCG/ACT inhaler Inhale 2 puffs into the lungs every 4 (four) hours as needed for wheezing or shortness of breath. 11/11/18   Kem Boroughsriplett, Jasara Corrigan B, FNP  benzonatate (TESSALON) 200 MG capsule Take one cap TID PRN cough 07/01/18   Ovid Curdoemer, William P, PA-C  guaiFENesin-codeine (CHERATUSSIN AC) 100-10 MG/5ML syrup Take 5 mLs by mouth 3 (three) times daily as needed for cough. 07/01/18   Lutricia Feiloemer, William P, PA-C  lisdexamfetamine (VYVANSE) 60 MG capsule Take by mouth.    [provider]  predniSONE (STERAPRED UNI-PAK 21 TAB) 10 MG (21) TBPK tablet Take 6 tablets on the first day and decrease by 1 tablet each day until finished. 11/11/18   Kelin Nixon, Kasandra Knudsenari B, FNP  VYVANSE 70 MG capsule TK 1 C PO QAM FOR FOCUS 06/03/18   [provider]    Allergies Sertraline and Antihistamines, chlorpheniramine-type  Family History  Problem Relation Age of Onset  .  Cirrhosis Father   . Hepatitis C Father     Social History Social History   Tobacco Use  . Smoking status: Never Smoker  . Smokeless tobacco: Never Used  Substance Use Topics  . Alcohol use: No  . Drug use: No    Review of Systems Constitutional: Positive for fever/chills.  Decreased appetite. ENT: No sore throat. Cardiovascular: Positive for chest pain. Respiratory: Negative for shortness of breath.  Positive for cough.  Negative for wheezing.  Gastrointestinal: No nausea, no vomiting.  No diarrhea.  Musculoskeletal: Positive for body aches Skin: Negative for rash. Neurological: Positive for headaches ____________________________________________   PHYSICAL EXAM:  VITAL SIGNS: ED Triage Vitals  Enc Vitals Group     BP 11/11/18 1138 114/76     Pulse Rate 11/11/18 1138 (!) 106     Resp 11/11/18 1138 20     Temp 11/11/18 1138 98.8 F (37.1 C)     Temp Source 11/11/18 1138 Oral     SpO2 11/11/18 1138 100 %     Weight 11/11/18 1142 135 lb (61.2 kg)     Height 11/11/18 1142 5\' 6"  (1.676 m)     Head Circumference --      Peak Flow --      Pain Score 11/11/18 1141 7     Pain Loc --      Pain Edu? --      Excl. in GC? --     Constitutional:  Alert and oriented.  Acutely ill appearing and in no acute distress. Eyes: Conjunctivae are normal. Ears: Bilateral TM normal Nose: Maxillary sinus congestion noted; no rhinnorhea. Mouth/Throat: Mucous membranes are moist.  Oropharynx normal. Tonsils flat. Uvula midline. Neck: No stridor.  Lymphatic: No cervical lymphadenopathy. Cardiovascular: Normal rate, regular rhythm. Good peripheral circulation. Respiratory: Respirations are even and unlabored.  No retractions. Clear to auscultation. Gastrointestinal: Soft and nontender.  Musculoskeletal: FROM x 4 extremities.  Neurologic:  Normal speech and language. Skin:  Skin is warm, dry and intact. No rash noted. Psychiatric: Mood and affect are normal. Speech and behavior are  normal.  ____________________________________________   LABS (all labs ordered are listed, but only abnormal results are displayed)  Labs Reviewed  URINALYSIS, COMPLETE (UACMP) WITH MICROSCOPIC - Abnormal; Notable for the following components:      Result Value   Color, Urine STRAW (*)    APPearance CLEAR (*)    Bacteria, UA RARE (*)    All other components within normal limits  CBC WITH DIFFERENTIAL/PLATELET - Abnormal; Notable for the following components:   WBC 3.9 (*)    Hemoglobin 10.4 (*)    HCT 34.1 (*)    MCV 78.4 (*)    MCH 23.9 (*)    All other components within normal limits  NOVEL CORONAVIRUS, NAA (HOSPITAL ORDER, SEND-OUT TO REF LAB)  PREGNANCY, URINE  BASIC METABOLIC PANEL  FIBRIN DERIVATIVES D-DIMER ([facility] ONLY)  TROPONIN I (HIGH SENSITIVITY)   ____________________________________________  EKG  ED ECG REPORT I, Erion Hermans, FNP-BC personally viewed and interpreted this ECG.   Date: 11/11/2018  EKG Time: 1309  Rate: 88  Rhythm: normal sinus rhythm  Axis: normal  Intervals:none  ST&T Change: no ST elevation  ____________________________________________  RADIOLOGY  Chest x-ray negative for acute abnormality. ____________________________________________   PROCEDURES  Procedure(s) performed: None  Critical Care performed: No ____________________________________________   INITIAL IMPRESSION / ASSESSMENT AND PLAN / ED COURSE  33 y.o. female who presents to the emergency department for treatment and evaluation of cough and chest pain with exposure to COVID-19.  Patient likely does have the COVID-19 virus, but due to the chest pain and shortness of breath and a 33 year old who is otherwise healthy screening labs including a d-dimer and troponin as well as an EKG will be done.  A 2-day send out COVID test will be sent as well.  D-dimer is 297.10 and does not indicate need for further studies to rule out PE.  Troponin is normal as well and the second  serial troponin has been canceled.  Patient will be discharged home and advised to quarantine herself at least until her COVID test is back.  If it is negative, she is to remain out of work for 72 hours after the last known fever without having taken Tylenol or ibuprofen.  If her COVID test is positive she was provided a work excuse that should cover her for the 2 weeks that are required for her to remain home.  Maria Jones was evaluated in Emergency Department on 11/11/2018 for the symptoms described in the history of present illness. She was evaluated in the context of the global COVID-19 pandemic, which necessitated consideration that the patient might be at risk for infection with the SARS-CoV-2 virus that causes COVID-19. Institutional protocols and algorithms that pertain to the evaluation of patients at risk for COVID-19 are in a state of rapid change based on information released by regulatory bodies including the CDC and federal and state  organizations. These policies and algorithms were followed during the patient's care in the ED.     Medications - No data to display  ED Discharge Orders         Ordered    predniSONE (STERAPRED UNI-PAK 21 TAB) 10 MG (21) TBPK tablet     11/11/18 1501    albuterol (VENTOLIN HFA) 108 (90 Base) MCG/ACT inhaler  Every 4 hours PRN     11/11/18 1525           Pertinent labs & imaging results that were available during my care of the patient were reviewed by me and considered in my medical decision making (see chart for details).    If controlled substance prescribed during this visit, 12 month history viewed on the [facility] prior to issuing an initial prescription for Schedule II or III opiod. ____________________________________________   FINAL CLINICAL IMPRESSION(S) / ED DIAGNOSES  Final diagnoses:  Acute viral syndrome    Note:  This document was prepared using Dragon voice recognition software and may include unintentional dictation  errors.    Chinita Pesterriplett, Scheryl Sanborn B, FNP 11/11/18 1533    Sharman CheekStafford, Phillip, MD 11/12/18 [id number]

## 2018-11-13 ENCOUNTER — Emergency Department
Admission: EM | Admit: 2018-11-13 | Discharge: 2018-11-13 | Disposition: A | Discharge status: Home / Self Care | Payer: No Typology Code available for payment source | Attending: Emergency Medicine | Admitting: Emergency Medicine

## 2018-11-13 ENCOUNTER — Other Ambulatory Visit: Payer: Self-pay

## 2018-11-13 ENCOUNTER — Emergency Department: Payer: No Typology Code available for payment source

## 2018-11-13 DIAGNOSIS — Z79899 Other long term (current) drug therapy: Secondary | ICD-10-CM | POA: Insufficient documentation

## 2018-11-13 DIAGNOSIS — B349 Viral infection, unspecified: Secondary | ICD-10-CM | POA: Diagnosis not present

## 2018-11-13 DIAGNOSIS — R509 Fever, unspecified: Secondary | ICD-10-CM | POA: Diagnosis present

## 2018-11-13 LAB — CBC
HCT: 35.4 % — ABNORMAL LOW (ref 36.0–46.0)
Hemoglobin: 10.8 g/dL — ABNORMAL LOW (ref 12.0–15.0)
MCH: 23.9 pg — ABNORMAL LOW (ref 26.0–34.0)
MCHC: 30.5 g/dL (ref 30.0–36.0)
MCV: 78.3 fL — ABNORMAL LOW (ref 80.0–100.0)
Platelets: 241 10*3/uL (ref 150–400)
RBC: 4.52 MIL/uL (ref 3.87–5.11)
RDW: 14.3 % (ref 11.5–15.5)
WBC: 3.9 10*3/uL — ABNORMAL LOW (ref 4.0–10.5)
nRBC: 0 % (ref 0.0–0.2)

## 2018-11-13 LAB — URINALYSIS, COMPLETE (UACMP) WITH MICROSCOPIC
Bacteria, UA: NONE SEEN
Bilirubin Urine: NEGATIVE
Glucose, UA: NEGATIVE mg/dL
Hgb urine dipstick: NEGATIVE
Ketones, ur: NEGATIVE mg/dL
Leukocytes,Ua: NEGATIVE
Nitrite: NEGATIVE
Protein, ur: NEGATIVE mg/dL
Specific Gravity, Urine: 1.005 (ref 1.005–1.030)
pH: 8 (ref 5.0–8.0)

## 2018-11-13 LAB — BASIC METABOLIC PANEL
Anion gap: 8 (ref 5–15)
BUN: 10 mg/dL (ref 6–20)
CO2: 25 mmol/L (ref 22–32)
Calcium: 8.9 mg/dL (ref 8.9–10.3)
Chloride: 104 mmol/L (ref 98–111)
Creatinine, Ser: 0.68 mg/dL (ref 0.44–1.00)
GFR calc Af Amer: >60 mL/min (ref >60)
GFR calc non Af Amer: >60 mL/min (ref >60)
Glucose, Bld: 96 mg/dL (ref 70–99)
Potassium: 3.8 mmol/L (ref 3.5–5.1)
Sodium: 137 mmol/L (ref 135–145)

## 2018-11-13 LAB — FIBRIN DERIVATIVES D-DIMER (ARMC ONLY): Fibrin derivatives D-dimer (ARMC): 306.89 ng/mL (FEU) (ref 0.00–499.00)

## 2018-11-13 MED ORDER — CYCLOBENZAPRINE HCL 5 MG PO TABS: ORAL_TABLET | ORAL | 0 refills | Status: AC

## 2018-11-13 MED ORDER — CYCLOBENZAPRINE HCL 5 MG PO TABS: ORAL_TABLET | ORAL | 0 refills | Status: DC

## 2018-11-13 NOTE — ED Triage Notes (Signed)
Pt was here Saturday for cough, fever, congestion. Pt reports that since visit, she has developed upper back pain. Body aches, fever and nonproductive cough worsening today. Pt has a pending COVID test.

## 2018-11-13 NOTE — ED Notes (Signed)
See triage note  States she was seen couple of days ago after being seen for exposure to COVID pt.  States she is having more joint pain and still having some discomfort in chest with inspiration

## 2018-11-13 NOTE — ED Provider Notes (Signed)
[facility] Emergency Department Provider Note  ____________________________________________  Time seen: Approximately 4:36 PM  I have reviewed the triage vital signs and the nursing notes.   HISTORY  Chief Complaint No chief complaint on file.    HPI Maria Jones is a 33 y.o. female that presents to the emergency department for evaluation of fever, headache, nasal congestion, body aches, cough that is occasionally productive for 4 days.  Patient was evaluated in the emergency department 2 days ago and had a COVID test sent.  COVID results have not been received yet.  Patient states that her cough and nasal congestion is improving.  Fevers the first 2 days were 101-102 in the last 2 days have been 99-101.  Her back pain is worsening.  Pain is worse when she moves or presses her back.  Pain goes "from her shoulders to her kidneys."  Upper back pain also occasionally wraps around to her sides.  She is concerned that her white blood cell count was low 2 days ago and like would like this rechecked.  She would also like her kidneys checked since pain goes down her back to her kidneys.  She would also like another d-dimer.  Patient is a paramedic.  No chest pain, shortness of breath, vomiting.   Past Medical History:  Diagnosis Date  . ADHD   . Anxiety     There are no active problems to display for this patient.   Past Surgical History:  Procedure Laterality Date  . C Section     . LEEP Surgery      Prior to Admission medications   Medication Sig Start Date End Date Taking? Authorizing Provider  albuterol (VENTOLIN HFA) 108 (90 Base) MCG/ACT inhaler Inhale 2 puffs into the lungs every 4 (four) hours as needed for wheezing or shortness of breath. 11/11/18   Sherrie George B, FNP  benzonatate (TESSALON) 200 MG capsule Take one cap TID PRN cough 07/01/18   Lorin Picket, PA-C  cyclobenzaprine (FLEXERIL) 5 MG tablet Take 1-2 tablets 3 times daily as needed  11/13/18   Laban Emperor, PA-C  guaiFENesin-codeine (CHERATUSSIN AC) 100-10 MG/5ML syrup Take 5 mLs by mouth 3 (three) times daily as needed for cough. 07/01/18   Lorin Picket, PA-C  lisdexamfetamine (VYVANSE) 60 MG capsule Take by mouth.    [provider]  prednisoLONE (ORAPRED) 15 MG/5ML solution Take 10 mLs (30 mg total) by mouth 2 (two) times daily for 5 days. 11/11/18 11/16/18  Lannie Fields, PA-C  VYVANSE 70 MG capsule TK 1 C PO QAM FOR FOCUS 06/03/18   [provider]    Allergies Sertraline and Antihistamines, chlorpheniramine-type  Family History  Problem Relation Age of Onset  . Cirrhosis Father   . Hepatitis C Father     Social History Social History   Tobacco Use  . Smoking status: Never Smoker  . Smokeless tobacco: Never Used  Substance Use Topics  . Alcohol use: No  . Drug use: No     Review of Systems  Constitutional: No fever/chills Eyes: No visual changes. No discharge. ENT: Positive for congestion and rhinorrhea. Cardiovascular: No chest pain. Respiratory: Positive for cough. No SOB. Gastrointestinal: No abdominal pain.  No nausea, no vomiting.  No diarrhea.  No constipation. Musculoskeletal: Positive for back pain. Skin: Negative for rash, abrasions, lacerations, ecchymosis. Neurological: Negative for headaches.   ____________________________________________   PHYSICAL EXAM:  VITAL SIGNS: ED Triage Vitals  Enc Vitals Group  BP 11/13/18 1420 132/79     Pulse Rate 11/13/18 1420 97     Resp 11/13/18 1420 18     Temp 11/13/18 1420 99.4 F (37.4 C)     Temp Source 11/13/18 1420 Oral     SpO2 11/13/18 1420 100 %     Weight 11/13/18 1421 134 lb (60.8 kg)     Height 11/13/18 1421 5\' 6"  (1.676 m)     Head Circumference --      Peak Flow --      Pain Score 11/13/18 1421 10     Pain Loc --      Pain Edu? --      Excl. in GC? --      Constitutional: Alert and oriented. Well appearing and in no acute distress.  Patient  appears comfortable. Eyes: Conjunctivae are normal. PERRL. EOMI. No discharge. Head: Atraumatic. ENT: No frontal and maxillary sinus tenderness.      Ears: Tympanic membranes pearly gray with good landmarks. No discharge.      Nose: No congestion/rhinnorhea.      Mouth/Throat: Mucous membranes are moist. Oropharynx non-erythematous. Tonsils not enlarged. No exudates. Uvula midline. Neck: No stridor.   Hematological/Lymphatic/Immunilogical: No cervical lymphadenopathy. Cardiovascular: Normal rate, regular rhythm.  Good peripheral circulation. Respiratory: Normal respiratory effort without tachypnea or retractions. Lungs CTAB. Good air entry to the bases with no decreased or absent breath sounds. Gastrointestinal: Bowel sounds 4 quadrants. Soft and nontender to palpation. No guarding or rigidity. No palpable masses. No distention. Musculoskeletal: Full range of motion to all extremities. No gross deformities appreciated.  Tenderness to palpation diffusely to upper back.  Pain elicited with range of motion of bilateral shoulders.  Normal gait. Neurologic:  Normal speech and language. No gross focal neurologic deficits are appreciated.  Skin:  Skin is warm, dry and intact. No rash noted. Psychiatric: Mood and affect are normal. Speech and behavior are normal. Patient exhibits appropriate insight and judgement.   ____________________________________________   LABS (all labs ordered are listed, but only abnormal results are displayed)  Labs Reviewed  CBC - Abnormal; Notable for the following components:      Result Value   WBC 3.9 (*)    Hemoglobin 10.8 (*)    HCT 35.4 (*)    MCV 78.3 (*)    MCH 23.9 (*)    All other components within normal limits  URINALYSIS, COMPLETE (UACMP) WITH MICROSCOPIC - Abnormal; Notable for the following components:   Color, Urine COLORLESS (*)    APPearance CLEAR (*)    All other components within normal limits  BASIC METABOLIC PANEL  FIBRIN DERIVATIVES  D-DIMER ([facility] ONLY)  POC URINE PREG, ED   ____________________________________________  EKG   ____________________________________________  RADIOLOGY Lexine BatonI, Dalanie Kisner, personally viewed and evaluated these images (plain radiographs) as part of my medical decision making, as well as reviewing the written report by the radiologist.  Dg Chest Portable 1 View  Result Date: 11/13/2018 CLINICAL DATA:  Cough, congestion and fever and back pain. EXAM: PORTABLE CHEST 1 VIEW COMPARISON:  11/11/2018 FINDINGS: The cardiac silhouette, mediastinal and hilar contours are normal. The lungs are clear. No pleural effusion. The bony thorax is intact. IMPRESSION: No acute cardiopulmonary findings. Electronically Signed   By: Rudie MeyerP.  Gallerani M.D.   On: 11/13/2018 15:10    ____________________________________________    PROCEDURES  Procedure(s) performed:    Procedures    Medications - No data to display   ____________________________________________   INITIAL IMPRESSION / ASSESSMENT  AND PLAN / ED COURSE  Pertinent labs & imaging results that were available during my care of the patient were reviewed by me and considered in my medical decision making (see chart for details).  Review of the [place] CSRS was performed in accordance of the [facility] prior to dispensing any controlled drugs.     Patient's diagnosis is consistent with viral respiratory illness.  Vital signs and exam are reassuring.  Chest x-ray negative for acute cardiopulmonary processes.  CBC remarkable for mildly low WBC of 3.9.  Hemoglobin increased from 2 days ago at 10.8 and hematocrit 35.4.  BMP within normal limits.  D-dimer within normal limits.  Back pain is reproducible with palpation.  COVID test is still currently pending.  Patient overall appears very well.  She is talkative.  Patient feels comfortable going home. Patient will be discharged home with prescriptions for Flexeril. Patient is to follow up with primary care as needed  or otherwise directed. Patient is given ED precautions to return to the ED for any worsening or new symptoms.   Blase MessMichelle L Forbush was evaluated in Emergency Department on 11/13/2018 for the symptoms described in the history of present illness. She was evaluated in the context of the global COVID-19 pandemic, which necessitated consideration that the patient might be at risk for infection with the SARS-CoV-2 virus that causes COVID-19. Institutional protocols and algorithms that pertain to the evaluation of patients at risk for COVID-19 are in a state of rapid change based on information released by regulatory bodies including the CDC and federal and state organizations. These policies and algorithms were followed during the patient's care in the ED.  ____________________________________________  FINAL CLINICAL IMPRESSION(S) / ED DIAGNOSES  Final diagnoses:  Viral illness      NEW MEDICATIONS STARTED DURING THIS VISIT:  ED Discharge Orders         Ordered    cyclobenzaprine (FLEXERIL) 5 MG tablet  Status:  Discontinued     11/13/18 1819    cyclobenzaprine (FLEXERIL) 5 MG tablet     11/13/18 1824              This chart was dictated using voice recognition software/Dragon. Despite best efforts to proofread, errors can occur which can change the meaning. Any change was purely unintentional.    Enid DerryWagner, Masha Orbach, PA-C 11/13/18 1842    Dionne BucySiadecki, Sebastian, MD 11/13/18 2106

## 2018-11-16 ENCOUNTER — Telehealth: Payer: Self-pay | Admitting: Emergency Medicine

## 2018-11-16 NOTE — Telephone Encounter (Signed)
Called patient and she is aware of positive covid test and in contact with [facility].

## 2018-11-17 LAB — NOVEL CORONAVIRUS, NAA (HOSP ORDER, SEND-OUT TO REF LAB; TAT 18-24 HRS): SARS-CoV-2, NAA: DETECTED — AB

## 2018-12-09 ENCOUNTER — Other Ambulatory Visit: Payer: Self-pay

## 2018-12-09 ENCOUNTER — Emergency Department: Payer: Medicaid Other

## 2018-12-09 ENCOUNTER — Emergency Department
Admission: EM | Admit: 2018-12-09 | Discharge: 2018-12-10 | Disposition: A | Discharge status: Home / Self Care | Payer: Medicaid Other | Attending: Emergency Medicine | Admitting: Emergency Medicine

## 2018-12-09 DIAGNOSIS — Z79899 Other long term (current) drug therapy: Secondary | ICD-10-CM | POA: Insufficient documentation

## 2018-12-09 DIAGNOSIS — E876 Hypokalemia: Secondary | ICD-10-CM | POA: Diagnosis not present

## 2018-12-09 DIAGNOSIS — R079 Chest pain, unspecified: Secondary | ICD-10-CM

## 2018-12-09 DIAGNOSIS — R0789 Other chest pain: Secondary | ICD-10-CM | POA: Diagnosis present

## 2018-12-09 DIAGNOSIS — U071 COVID-19: Secondary | ICD-10-CM | POA: Diagnosis not present

## 2018-12-09 DIAGNOSIS — Z888 Allergy status to other drugs, medicaments and biological substances status: Secondary | ICD-10-CM | POA: Diagnosis not present

## 2018-12-09 DIAGNOSIS — D509 Iron deficiency anemia, unspecified: Secondary | ICD-10-CM | POA: Insufficient documentation

## 2018-12-09 DIAGNOSIS — R0781 Pleurodynia: Secondary | ICD-10-CM

## 2018-12-09 DIAGNOSIS — R072 Precordial pain: Secondary | ICD-10-CM | POA: Diagnosis not present

## 2018-12-09 LAB — BASIC METABOLIC PANEL
Anion gap: 7 (ref 5–15)
BUN: 10 mg/dL (ref 6–20)
CO2: 26 mmol/L (ref 22–32)
Calcium: 9.5 mg/dL (ref 8.9–10.3)
Chloride: 103 mmol/L (ref 98–111)
Creatinine, Ser: 0.7 mg/dL (ref 0.44–1.00)
GFR calc Af Amer: >60 mL/min (ref >60)
GFR calc non Af Amer: >60 mL/min (ref >60)
Glucose, Bld: 97 mg/dL (ref 70–99)
Potassium: 3.1 mmol/L — ABNORMAL LOW (ref 3.5–5.1)
Sodium: 136 mmol/L (ref 135–145)

## 2018-12-09 LAB — CBC
HCT: 31 % — ABNORMAL LOW (ref 36.0–46.0)
Hemoglobin: 9.5 g/dL — ABNORMAL LOW (ref 12.0–15.0)
MCH: 24.1 pg — ABNORMAL LOW (ref 26.0–34.0)
MCHC: 30.6 g/dL (ref 30.0–36.0)
MCV: 78.7 fL — ABNORMAL LOW (ref 80.0–100.0)
Platelets: 337 10*3/uL (ref 150–400)
RBC: 3.94 MIL/uL (ref 3.87–5.11)
RDW: 15 % (ref 11.5–15.5)
WBC: 6.4 10*3/uL (ref 4.0–10.5)
nRBC: 0 % (ref 0.0–0.2)

## 2018-12-09 LAB — TROPONIN I (HIGH SENSITIVITY): Troponin I (High Sensitivity): <2 ng/L (ref <18)

## 2018-12-09 LAB — FIBRIN DERIVATIVES D-DIMER (ARMC ONLY): Fibrin derivatives D-dimer (ARMC): 222.02 ng/mL (FEU) (ref 0.00–499.00)

## 2018-12-09 LAB — POCT PREGNANCY, URINE: Preg Test, Ur: NEGATIVE

## 2018-12-09 LAB — SARS CORONAVIRUS 2 BY RT PCR (HOSPITAL ORDER, PERFORMED IN ~~LOC~~ HOSPITAL LAB): SARS Coronavirus 2: POSITIVE — AB

## 2018-12-09 MED ORDER — IBUPROFEN 600 MG PO TABS
600.0000 mg | ORAL_TABLET | ORAL | Status: AC
  Administered 2018-12-09: 600 mg via ORAL
  Filled 2018-12-09: qty 1

## 2018-12-09 MED ORDER — POTASSIUM CHLORIDE CRYS ER 20 MEQ PO TBCR
40.0000 meq | EXTENDED_RELEASE_TABLET | Freq: Once | ORAL | Status: AC
  Administered 2018-12-09: 40 meq via ORAL
  Filled 2018-12-09: qty 2

## 2018-12-09 MED ORDER — SODIUM CHLORIDE 0.9 % IV BOLUS
1000.0000 mL | Freq: Once | INTRAVENOUS | Status: AC
  Administered 2018-12-09: 1000 mL via INTRAVENOUS

## 2018-12-09 NOTE — ED Triage Notes (Signed)
Reports chest pain for approximately a week, tonight pain worse.

## 2018-12-09 NOTE — ED Notes (Signed)
Date and time results received: 12/09/18 11:38 PM (use smartphrase ".now" to insert current time)  Test: COVID  Critical Value: positive  Name of Provider Notified: Jacqualine Code, MD  Orders Received? Or Actions Taken?: notified

## 2018-12-09 NOTE — ED Notes (Signed)
Ultra sound to bedside.

## 2018-12-09 NOTE — Discharge Instructions (Addendum)
You have been seen in the Emergency Department (ED) today for chest pain.  As we have discussed todays test results are normal, but you may require further testing.  Please follow up with the recommended doctor as instructed above in these documents regarding todays emergent visit and your recent symptoms to discuss further management.  Continue to take your regular medications. If you are not doing so already, please also take a daily baby aspirin (81 mg), at least until you follow up with your doctor.  Your coronavirus test is still positive.  Please follow-up with [place] health department Monday so they can decide whether or not you should return to work.  Stay out of work until that time.  Return to the Emergency Department (ED) if you experience any further chest pain/pressure/tightness, difficulty breathing, or sudden sweating, or other symptoms that concern you.

## 2018-12-09 NOTE — ED Provider Notes (Signed)
[facility] Emergency Department Provider Note   ____________________________________________   First MD Initiated Contact with Patient 12/09/18 2203     (approximate)  I have reviewed the triage vital signs and the nursing notes.   HISTORY  Chief Complaint Chest Pain    HPI Blase MessMichelle L Severin is a 33 y.o. female   history of ADHD, and also diagnosis of coronavirus about 1 month ago.  Patient reports he been doing well until about 5-6 days ago she noticed occasionally having a slight sharp pain over the left chest with breathing.  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  This is continue to be off and on, she is checked several EKGs on herself today.  She continues to have pain sharp left-sided chest, took 1 baby aspirin today and has been on a baby aspirin regimen since being diagnosed with COVID as well.  Does not smoke.  No leg swelling.  No history of blood clots.  No long trips or travel history.  No recent major surgeries or hospitalizations.  She reports she recovered well from COVID, was having fevers body aches cough and those symptoms have not returned.  She reports left-sided chest pain rather sharp in nature.  Past Medical History:  Diagnosis Date  . ADHD   . Anxiety     There are no active problems to display for this patient.   Past Surgical History:  Procedure Laterality Date  . C Section     . LEEP Surgery      Prior to Admission medications   Medication Sig Start Date End Date Taking? Authorizing Provider  albuterol (VENTOLIN HFA) 108 (90 Base) MCG/ACT inhaler Inhale 2 puffs into the lungs every 4 (four) hours as needed for wheezing or shortness of breath. 11/11/18   Kem Boroughsriplett, Cari B, FNP  benzonatate (TESSALON) 200 MG capsule Take one cap TID PRN cough 07/01/18   Lutricia Feiloemer, William P, PA-C  cyclobenzaprine (FLEXERIL) 5 MG  tablet Take 1-2 tablets 3 times daily as needed 11/13/18   Enid DerryWagner, Ashley, PA-C  guaiFENesin-codeine (CHERATUSSIN AC) 100-10 MG/5ML syrup Take 5 mLs by mouth 3 (three) times daily as needed for cough. 07/01/18   Lutricia Feiloemer, William P, PA-C  lisdexamfetamine (VYVANSE) 60 MG capsule Take by mouth.    [provider]  VYVANSE 70 MG capsule TK 1 C PO QAM FOR FOCUS 06/03/18   [provider]    Allergies Sertraline and Antihistamines, chlorpheniramine-type  Family History  Problem Relation Age of Onset  . Cirrhosis Father   . Hepatitis C Father     Social History Social History   Tobacco Use  . Smoking status: Never Smoker  . Smokeless tobacco: Never Used  Substance Use Topics  . Alcohol use: No  . Drug use: No    Review of Systems Constitutional: No fever/chills Eyes: No visual changes. ENT: No sore throat. Cardiovascular: No heavy chest pressure.  Pain is sharp located over the left "4th-5th intercostal space" Respiratory: Denies shortness of breath. Gastrointestinal: No abdominal pain.   Genitourinary: Negative for dysuria.  Denies pregnancy, menstrual cycle started a couple days ago. Musculoskeletal: Negative for back pain. Skin: Negative for rash. Neurological: Negative for headaches, areas of focal weakness or numbness.    ____________________________________________   PHYSICAL EXAM:  VITAL SIGNS: ED Triage Vitals  Enc Vitals Group     BP 12/09/18 2201  120/83     Pulse Rate 12/09/18 2201 96     Resp 12/09/18 2201 (!) 22     Temp 12/09/18 2201 98.3 F (36.8 C)     Temp Source 12/09/18 2201 Oral     SpO2 12/09/18 2201 100 %     Weight 12/09/18 2159 139 lb (63 kg)     Height 12/09/18 2159 5\' 6"  (1.676 m)     Head Circumference --      Peak Flow --      Pain Score 12/09/18 2158 8     Pain Loc --      Pain Edu? --      Excl. in GC? --     Constitutional: Alert and oriented. Well appearing and in no acute distress.  Slightly anxious. Eyes:  Conjunctivae are normal. Head: Atraumatic. Nose: No congestion/rhinnorhea. Mouth/Throat: Mucous membranes are moist. Neck: No stridor.  Cardiovascular: Normal rate, regular rhythm.  Plan heart rate varies between about 85-95. Grossly normal heart sounds.  Good peripheral circulation. Respiratory: Normal respiratory effort.  No retractions. Lungs CTAB.  Very clear lungs.  Speaks in full clear sentences without distress noted. Gastrointestinal: Soft and nontender. No distention. Musculoskeletal: No lower extremity tenderness nor edema.  No venous cords or congestion. Neurologic:  Normal speech and language. No gross focal neurologic deficits are appreciated.  Skin:  Skin is warm, dry and intact. No rash noted. Psychiatric: Mood and affect are normal though just slightly anxious. Speech and behavior are normal.  ____________________________________________   LABS (all labs ordered are listed, but only abnormal results are displayed)  Labs Reviewed  CBC - Abnormal; Notable for the following components:      Result Value   Hemoglobin 9.5 (*)    HCT 31.0 (*)    MCV 78.7 (*)    MCH 24.1 (*)    All other components within normal limits  BASIC METABOLIC PANEL - Abnormal; Notable for the following components:   Potassium 3.1 (*)    All other components within normal limits  SARS CORONAVIRUS 2 (HOSPITAL ORDER, PERFORMED IN [facility])  FIBRIN DERIVATIVES D-DIMER ([facility] ONLY)  POC URINE PREG, ED  POCT PREGNANCY, URINE  TROPONIN I (HIGH SENSITIVITY)   ____________________________________________  EKG  ED ECG REPORT I, Sharyn CreamerMark Andalyn Heckstall, the attending physician, personally viewed and interpreted this ECG.  Date: 12/09/2018 EKG Time: 2155 Rate: 95 Rhythm: normal sinus rhythm QRS Axis: normal Intervals: normal ST/T Wave abnormalities: normal Narrative Interpretation: no evidence of acute ischemia  ____________________________________________  RADIOLOGY  Dg Chest Port 1  View  Result Date: 12/09/2018 CLINICAL DATA:  33 year old female with chest pain. EXAM: PORTABLE CHEST 1 VIEW COMPARISON:  Chest radiograph dated 11/13/2018 FINDINGS: The heart size and mediastinal contours are within normal limits. Both lungs are clear. The visualized skeletal structures are unremarkable. IMPRESSION: No active disease. Electronically Signed   By: Elgie CollardArash  Radparvar M.D.   On: 12/09/2018 23:02    Imaging reviewed by me, chest x-ray reassuring.  No evidence of infiltrate ____________________________________________   PROCEDURES  Procedure(s) performed: None  Procedures  Critical Care performed: No  ____________________________________________   INITIAL IMPRESSION / ASSESSMENT AND PLAN / ED COURSE  Pertinent labs & imaging results that were available during my care of the patient were reviewed by me and considered in my medical decision making (see chart for details).   Differential diagnosis includes, but is not limited to, ACS, aortic dissection, pulmonary embolism, cardiac tamponade, pneumothorax, pneumonia, pericarditis, myocarditis, GI-related causes  including esophagitis/gastritis, and musculoskeletal chest wall pain, COVID virus reactivation/infection.           Pulmonary Embolism Rule-out Criteria (PERC rule)                        If YES to ANY of the following, the PERC rule is not satisfied and cannot be used to rule out PE in this patient (consider d-dimer or imaging depending on pre-test probability).                      If NO to ALL of the following, AND the clinician's pre-test probability is <15%, the [facility] rule is satisfied and there is no need for further workup (including no need to obtain a d-dimer) as the post-test probability of pulmonary embolism is <2%.                      Mnemonic is HAD CLOTS   H - hormone use (exogenous estrogen)      No. A - age > 50                                                 No. D - DVT/PE history                                       No.   C - coughing blood (hemoptysis)                 No. L - leg swelling, unilateral                             No. O - O2 Sat on Room Air < 95%                  No. T - tachycardia (HR ? 100)                         No. S - surgery or trauma, recent                      No.   Patient is felt to be very low risk for PE, except she has had COVID-19 infection.  She has no clinical evidence of PE, I suspect she is describing some type of pleurisy or musculoskeletal etiology of her pain.  EKG is normal.  Will obtain chest x-ray, repeat COVID test in the event of possible reinfection, and I discussed with the patient given there is thought to be a slightly increased risk at least of prothrombotic events with COVID-19 I had recommended CT angiography, but with shared medical decision making patient would like to avoid this unless she has a positive d-dimer which I think is also a reasonable strategy.  She is very low risk by [facility] score, but given her COVID-19 diagnosis we will obtain d-dimer and lower extremity ultrasounds to exclude PE.  She is not tachycardic, she is not hypoxic, there is no overt symptomatology suggest this is an obvious pulmonary embolism type of issue.   Heart score (without troponin, pending) is low risk.   -----------------------------------------  11:29 PM on 12/09/2018 -----------------------------------------  Vitals:   12/09/18 2230 12/09/18 2300  BP: 120/79 116/79  Pulse: 87 68  Resp: 19 14  Temp:    SpO2: 100% 100%    Patient feeling improved.  She reports she is been resting now noticed her heart rate is better she is feeling better.  She appears quite comfortable in no distress and less anxious as well.  Patient improved, very low risk for ACS with a first troponin that is normal.  Discussed with patient, given her history of COVID we will await her repeat COVID test, if this is positive I would anticipate she would still be able to be  discharged but would likely need to be written out of work until she can appropriate follow-up with her employers occupational health/ [facility] for further direction  Ongoing care assigned to Dr. Juliette AlcideMelinda, follow-up on repeat troponin, COVID test result, and bilateral lower extremity venous ultrasound.  I do anticipate patient will be able to be discharged unless a concerning finding is noted with her troponin or ultrasound.    ____________________________________________   FINAL CLINICAL IMPRESSION(S) / ED DIAGNOSES  Final diagnoses:  Microcytic anemia  Hypokalemia  Acute nonspecific chest pain with low risk of coronary artery disease  Pleuritic chest pain        Note:  This document was prepared using Dragon voice recognition software and may include unintentional dictation errors       Sharyn CreamerQuale, Sae Handrich, MD 12/09/18 2332

## 2018-12-10 LAB — TROPONIN I (HIGH SENSITIVITY): Troponin I (High Sensitivity): <2 ng/L (ref <18)

## 2018-12-10 NOTE — ED Provider Notes (Signed)
Patient is coronavirus test is positive her d-dimer is negative.  Per course plan with Dr. Georgiana Spinner, will discharge her and have her follow-up with [place] for duty determination.  She will return if she is worse.   Nena Polio, MD 12/10/18 [phone]

## 2018-12-10 NOTE — ED Notes (Signed)
E signature pad not working. Pt educated on discharge instructions and verbalized understanding.  

## 2019-05-22 ENCOUNTER — Ambulatory Visit: Payer: PRIVATE HEALTH INSURANCE | Attending: Internal Medicine

## 2019-05-22 DIAGNOSIS — Z20822 Contact with and (suspected) exposure to covid-19: Secondary | ICD-10-CM

## 2019-05-23 LAB — NOVEL CORONAVIRUS, NAA: SARS-CoV-2, NAA: NOT DETECTED

## 2020-07-27 IMAGING — US VENOUS DOPPLER ULTRASOUND OF  LOWER EXTREMITIES
1 series · 13 of 24 positions shown · non-contrast
Comparison: None.

CLINICAL DATA: Pleuritic chest pain.  Prior history of [id number].



[Series 1: venous doppler ultrasound of lower extremities · 13 of 66 slices shown]
[im 1/66]
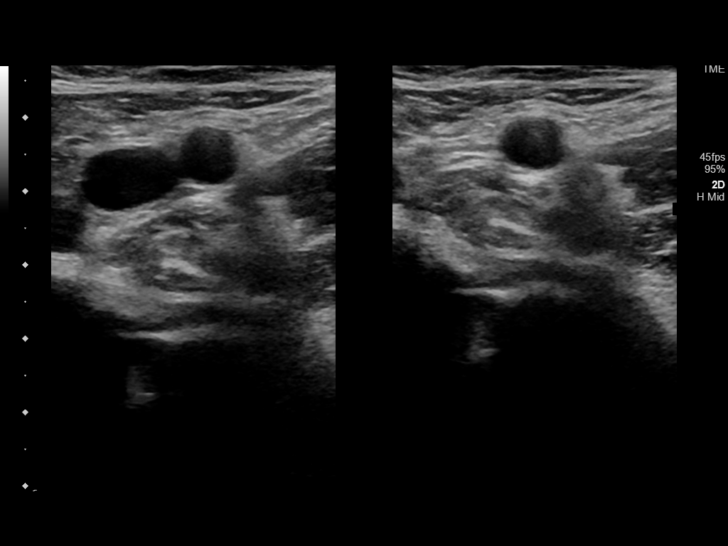
[im 6/66]
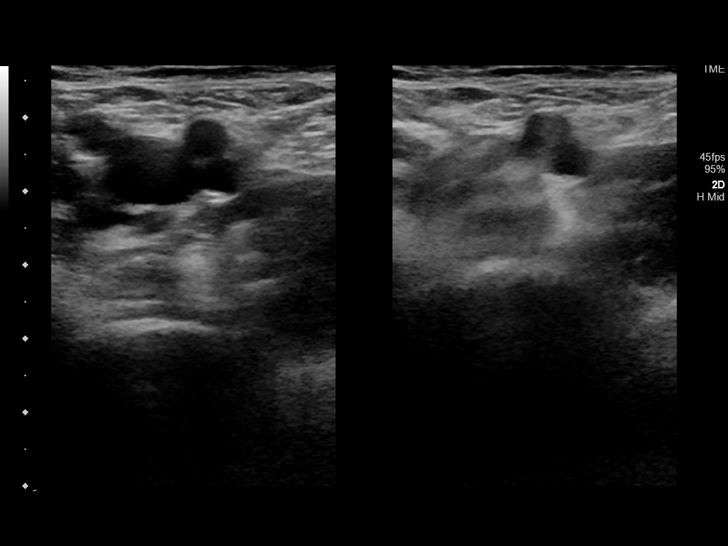
[im 12/66]
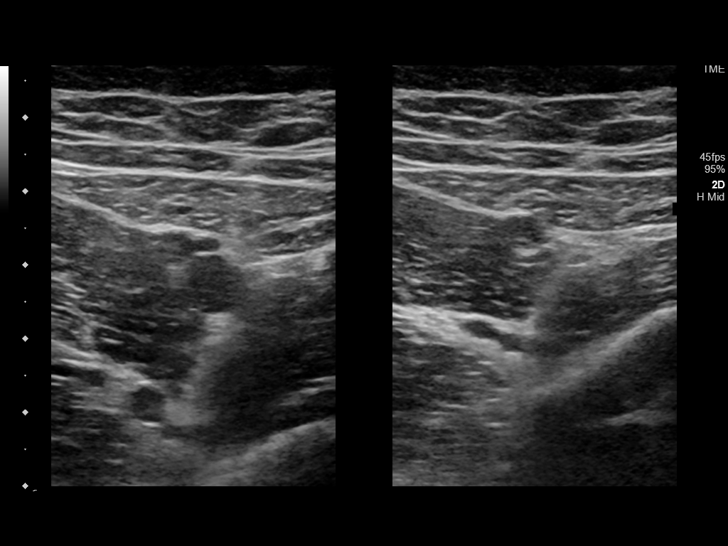
[im 17/66]
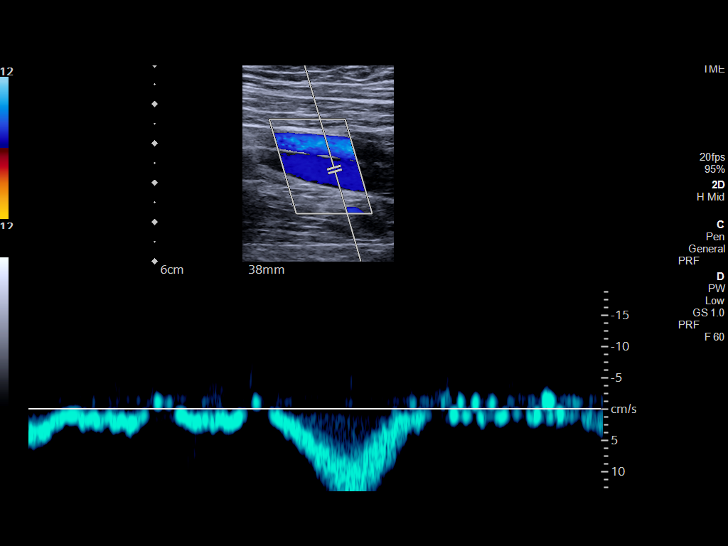
[im 23/66]
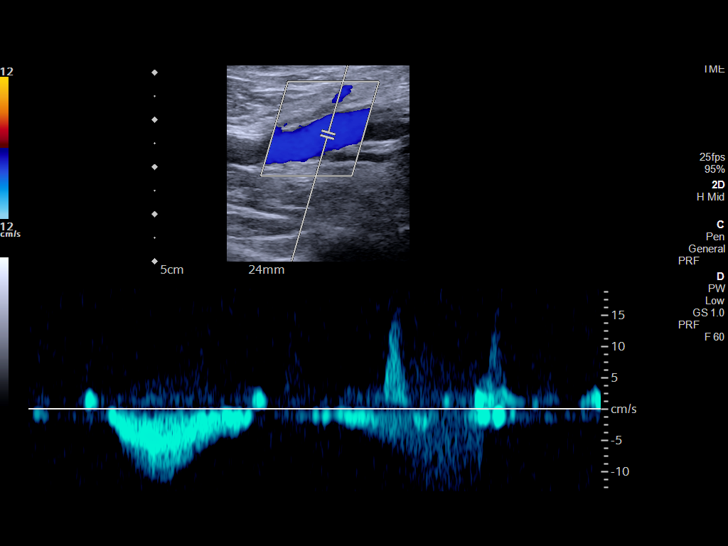
[im 29/66]
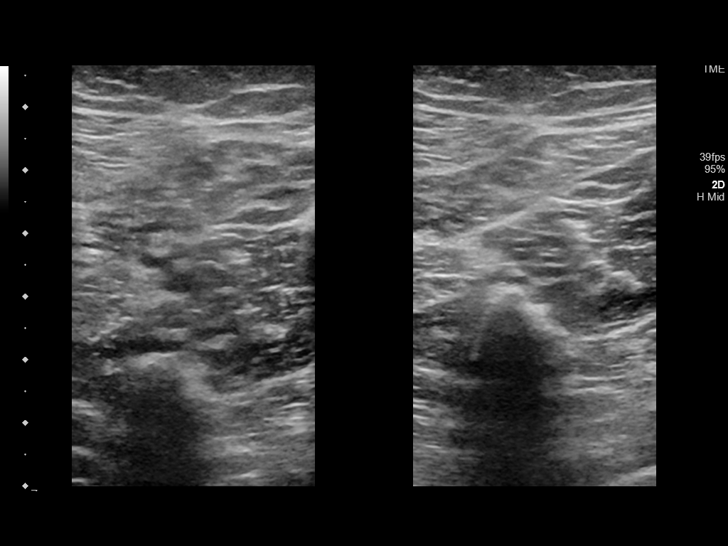
[im 34/66]
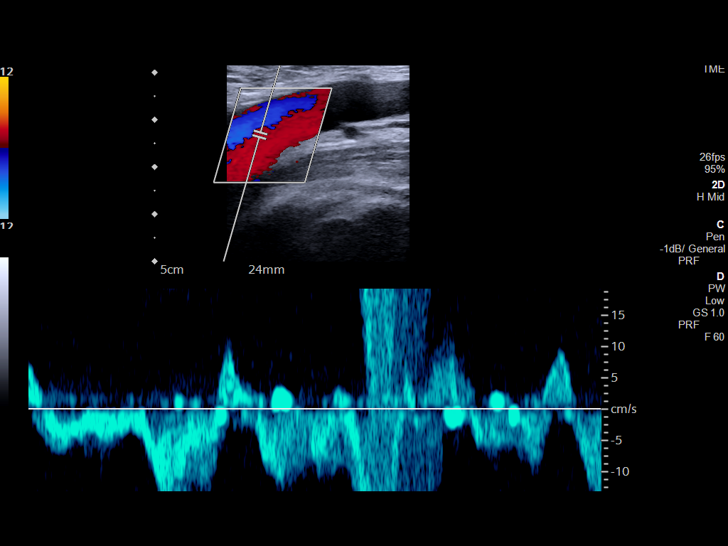
[im 37/66]
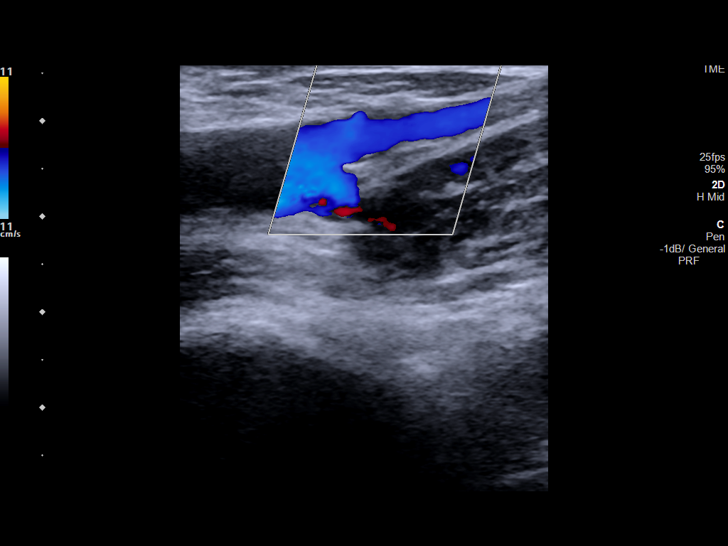
[im 43/66]
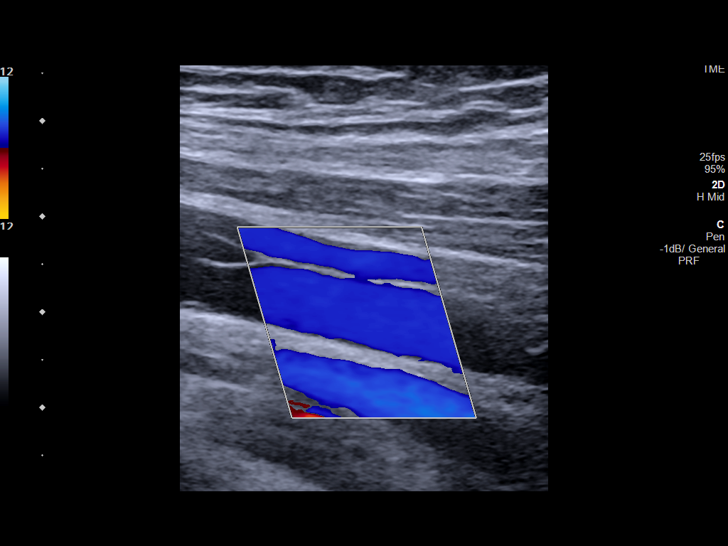
[im 49/66]
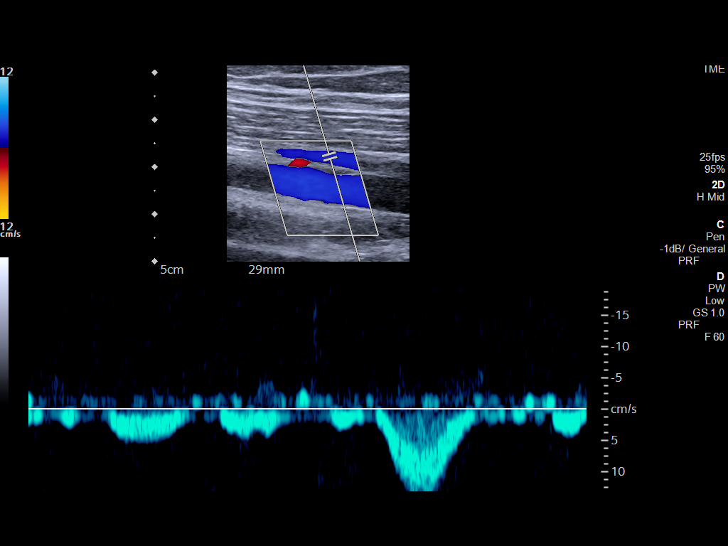
[im 54/66]
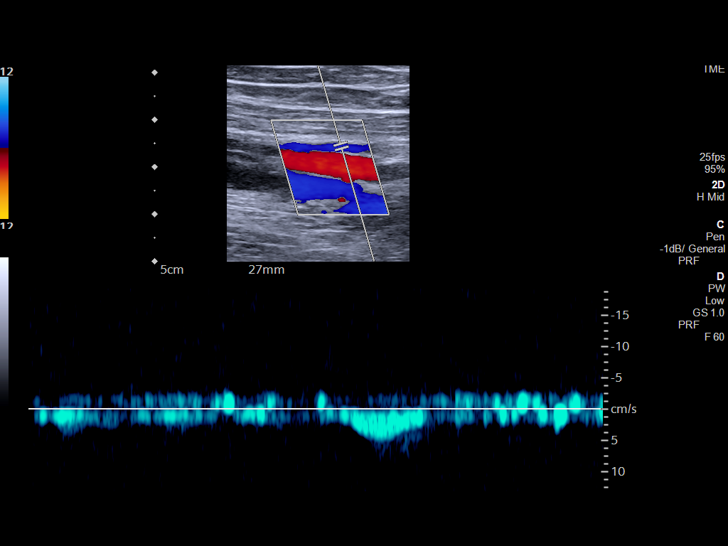
[im 60/66]
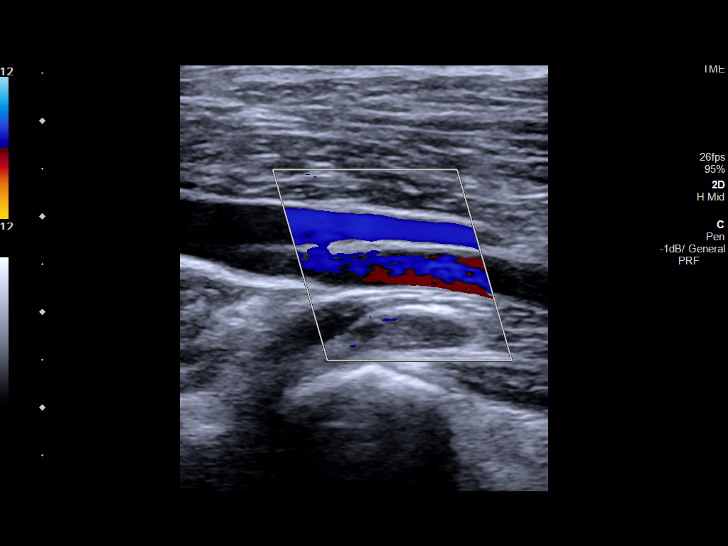
[im 66/66]
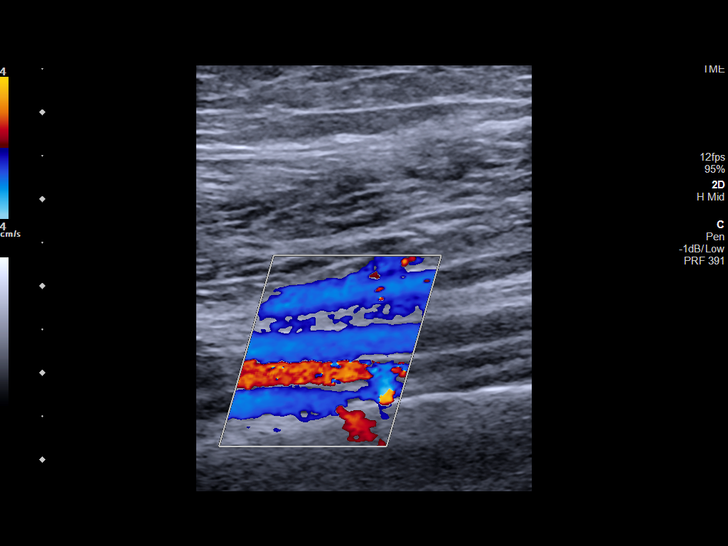

[13 of 24 positions shown; findings below may reference images not displayed]

FINDINGS: RIGHT LOWER EXTREMITY

Common Femoral Vein: No evidence of thrombus. Normal
compressibility, respiratory phasicity and response to augmentation.

Saphenofemoral Junction: No evidence of thrombus. Normal
compressibility and flow on color Doppler imaging.

Profunda Femoral Vein: No evidence of thrombus. Normal
compressibility and flow on color Doppler imaging.

Femoral Vein: No evidence of thrombus. Normal compressibility,
respiratory phasicity and response to augmentation.

Popliteal Vein: No evidence of thrombus. Normal compressibility,
respiratory phasicity and response to augmentation.

Calf Veins: No evidence of thrombus. Normal compressibility and flow
on color Doppler imaging.

Superficial Great Saphenous Vein: No evidence of thrombus. Normal
compressibility.

Venous Reflux:  None.

Other Findings:  None.

LEFT LOWER EXTREMITY

Common Femoral Vein: No evidence of thrombus. Normal
compressibility, respiratory phasicity and response to augmentation.

Saphenofemoral Junction: No evidence of thrombus. Normal
compressibility and flow on color Doppler imaging.

Profunda Femoral Vein: No evidence of thrombus. Normal
compressibility and flow on color Doppler imaging.

Femoral Vein: No evidence of thrombus. Normal compressibility,
respiratory phasicity and response to augmentation.

Popliteal Vein: No evidence of thrombus. Normal compressibility,
respiratory phasicity and response to augmentation.

Calf Veins: No evidence of thrombus. Normal compressibility and flow
on color Doppler imaging.

Superficial Great Saphenous Vein: No evidence of thrombus. Normal
compressibility.

Venous Reflux:  None.

Other Findings:  None.
IMPRESSION: No evidence of deep venous thrombosis in either lower extremity.

## 2021-10-12 ENCOUNTER — Ambulatory Visit (INDEPENDENT_AMBULATORY_CARE_PROVIDER_SITE_OTHER): Payer: Medicaid Other

## 2021-10-12 ENCOUNTER — Ambulatory Visit
Admission: EM | Admit: 2021-10-12 | Discharge: 2021-10-12 | Disposition: A | Discharge status: Home / Self Care | Payer: Medicaid Other | Attending: Physician Assistant | Admitting: Physician Assistant

## 2021-10-12 DIAGNOSIS — M546 Pain in thoracic spine: Secondary | ICD-10-CM

## 2021-10-12 DIAGNOSIS — F419 Anxiety disorder, unspecified: Secondary | ICD-10-CM

## 2021-10-12 DIAGNOSIS — R0782 Intercostal pain: Secondary | ICD-10-CM | POA: Diagnosis not present

## 2021-10-12 NOTE — ED Provider Notes (Signed)
[facility]    CSN: [id number] Arrival date & time: 10/12/21  1039      History   Chief Complaint Chief Complaint  Patient presents with   Back Pain    HPI Maria Jones is a 36 y.o. female presenting for concerns about left thoracic back pain has been off and on for the past several months.  She says it is a dull ache and is worse when she leans forward or extends her back.  No associated pleuritic pain.  No chest pain.  Patient reports occasional palpitations but states she has seen a cardiologist and they believe she may have POTS.  Patient reports being under a lot of stress recently especially over the past 1 week since her passed away.  He passed away suddenly in his lungs filled up with fluid and he denied.  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  Reports that she started seeing a therapist last week.  She does not take any medication and wants to avoid that if possible.  HPI  Past Medical History:  Diagnosis Date   ADHD    Anxiety     There are no problems to display for this patient.   Past Surgical History:  Procedure Laterality Date   C Section      LEEP Surgery      OB History   No obstetric history on file.      Home Medications    Prior to Admission medications   Medication Sig Start Date End Date Taking? Authorizing Provider  albuterol (VENTOLIN HFA) 108 (90 Base) MCG/ACT inhaler Inhale 2 puffs into the lungs every 4 (four) hours as needed for wheezing or shortness of breath. 11/11/18   Kem Boroughs B, FNP  benzonatate (TESSALON) 200 MG capsule Take one cap TID PRN cough 07/01/18   Lutricia Feil, PA-C  cyclobenzaprine (FLEXERIL) 5 MG tablet  Take 1-2 tablets 3 times daily as needed 11/13/18   Enid Derry, PA-C  guaiFENesin-codeine (CHERATUSSIN AC) 100-10 MG/5ML syrup Take 5 mLs by mouth 3 (three) times daily as needed for cough. 07/01/18   Lutricia Feil, PA-C  lisdexamfetamine (VYVANSE) 60 MG capsule Take by mouth.    [provider]  VYVANSE 70 MG capsule TK 1 C PO QAM FOR FOCUS 06/03/18   [provider]    Family History Family History  Problem Relation Age of Onset   Cirrhosis Father    Hepatitis C Father     Social History Social History   Tobacco Use   Smoking status: Never   Smokeless tobacco: Never  Vaping Use   Vaping Use: Never used  Substance Use Topics   Alcohol use: No   Drug use: No     Allergies   Sertraline and Antihistamines, chlorpheniramine-type   Review of Systems Review of Systems  Constitutional:  Negative for fatigue.  Respiratory:  Negative for shortness of breath.   Cardiovascular:  Positive for palpitations (intermittent, followed by cardiology). Negative for chest pain.  Gastrointestinal:  Negative for abdominal pain.  Musculoskeletal:  Positive for back pain.  Neurological:  Negative for dizziness, weakness, light-headedness and numbness.     Physical  Exam Triage Vital Signs ED Triage Vitals  Enc Vitals Group     BP 10/12/21 1120 116/81     Pulse Rate 10/12/21 1120 93     Resp --      Temp 10/12/21 1120 98.4 F (36.9 C)     Temp Source 10/12/21 1120 Oral     SpO2 10/12/21 1120 98 %     Weight 10/12/21 1119 167 lb (75.8 kg)     Height 10/12/21 1119 5\' 6"  (1.676 m)     Head Circumference --      Peak Flow --      Pain Score 10/12/21 1119 3     Pain Loc --      Pain Edu? --      Excl. in GC? --    No data found.  Updated Vital Signs BP 116/81 (BP Location: Left Arm)   Pulse 93   Temp 98.4 F (36.9 C) (Oral)   Ht 5\' 6"  (1.676 m)   Wt 167 lb (75.8 kg)   LMP 09/28/2021 (Approximate)   SpO2 98%   BMI 26.95 kg/m     Physical Exam Vitals  and nursing note reviewed.  Constitutional:      General: She is not in acute distress.    Appearance: Normal appearance. She is not ill-appearing or toxic-appearing.  HENT:     Head: Normocephalic and atraumatic.     Nose: Nose normal.     Mouth/Throat:     Mouth: Mucous membranes are moist.     Pharynx: Oropharynx is clear.  Eyes:     General: No scleral icterus.       Right eye: No discharge.        Left eye: No discharge.     Conjunctiva/sclera: Conjunctivae normal.  Cardiovascular:     Rate and Rhythm: Normal rate and regular rhythm.     Heart sounds: Normal heart sounds.  Pulmonary:     Effort: Pulmonary effort is normal. No respiratory distress.     Breath sounds: Normal breath sounds.  Abdominal:     Palpations: Abdomen is soft.     Tenderness: There is no abdominal tenderness.  Musculoskeletal:     Cervical back: Neck supple.     Thoracic back: Tenderness (left lower thoracic region/lateral left ribs) present. No bony tenderness. Normal range of motion.  Skin:    General: Skin is dry.  Neurological:     General: No focal deficit present.     Mental Status: She is alert. Mental status is at baseline.     Motor: No weakness.     Gait: Gait normal.  Psychiatric:        Attention and Perception: Attention normal.        Mood and Affect: Mood is anxious.        Speech: Speech is rapid and pressured.        Behavior: Behavior is cooperative.      UC Treatments / Results  Labs (all labs ordered are listed, but only abnormal results are displayed) Labs Reviewed - No data to display  EKG   Radiology DG Chest 2 View  Result Date: 10/12/2021 CLINICAL DATA:  Bilateral rib pain and thoracic back pain EXAM: CHEST - 2 VIEW COMPARISON:  None Available. FINDINGS: The heart size and mediastinal contours are within normal limits. Both lungs are clear. No pleural effusion or pneumothorax. The visualized skeletal structures are unremarkable. IMPRESSION: No acute process in  the chest. Electronically Signed  By: Guadlupe Spanish M.D.   On: 10/12/2021 11:54    Procedures Procedures (including critical care time)  Medications Ordered in UC Medications - No data to display  Initial Impression / Assessment and Plan / UC Course  I have reviewed the triage vital signs and the nursing notes.  Pertinent labs & imaging results that were available during my care of the patient were reviewed by me and considered in my medical decision making (see chart for details).  36 year old female presenting from left thoracic back pain over the past several months.  Patient also reports increased anxiety since her father passed away of pulmonary edema.  Requests chest x-ray.  Patient says back pain is worse with flexion and extension of back such as performing the cat cow exercise.  Denies any associated shortness of breath or painful breathing, chest pain.  Potential POTS, being followed by cardiology.  Vitals are normal and stable today.  No acute distress.  She is mildly anxious.  Tenderness palpation along the left thoracic region/left lower ribs.  Chest clear auscultation heart regular rate and rhythm.  Chest x-ray obtained.  Shows no acute abnormalities.  Discussed this with patient.  She appears to feel somewhat relieved by this.  Advised patient the thoracic back pain is likely musculoskeletal especially since she can reproduce it.  Advised RICE guidelines, ibuprofen and Tylenol.  Advised patient to continue going to therapy for her anxiety.  Also advised talking to her primary care provider about getting on possible medication at least to help in the short-term.   Final Clinical Impressions(s) / UC Diagnoses   Final diagnoses:  Acute left-sided thoracic back pain  Anxiety     Discharge Instructions      -Xray is normal.  -Back pain likely musculoskeletal  -Continue therapy. I really hope that helps you. I'm sorry for everything you've been through. Consider talking to  your doctor if therapy alone is not improving your anxiety and mood. Consider addition of medication--even if for a short time if you and your provider believe you need it.  BACK PAIN: Stressed avoiding painful activities . RICE (REST, ICE, COMPRESSION, ELEVATION) guidelines reviewed. May alternate ice and heat. Consider use of muscle rubs, Salonpas patches, etc. Use medications as directed including muscle relaxers if prescribed. Take anti-inflammatory medications as prescribed or OTC NSAIDs/Tylenol.  F/u with PCP in 7-10 days for reexamination, and please feel free to call or return to the urgent care at any time for any questions or concerns you may have and we will be happy to help you!   BACK PAIN RED FLAGS: If the back pain acutely worsens or there are any red flag symptoms such as numbness/tingling, leg weakness, saddle anesthesia, or loss of bowel/bladder control, go immediately to the ER. Follow up with [place] as scheduled or sooner if the pain does not begin to resolve or if it worsens before the follow up       ED Prescriptions   None    PDMP not reviewed this encounter.   Shirlee Latch, PA-C 10/12/21 1307

## 2021-10-12 NOTE — Discharge Instructions (Signed)
-  Xray is normal.  -Back pain likely musculoskeletal  -Continue therapy. I really hope that helps you. I'm sorry for everything you've been through. Consider talking to your doctor if therapy alone is not improving your anxiety and mood. Consider addition of medication--even if for a short time if you and your provider believe you need it.  BACK PAIN: Stressed avoiding painful activities . RICE (REST, ICE, COMPRESSION, ELEVATION) guidelines reviewed. May alternate ice and heat. Consider use of muscle rubs, Salonpas patches, etc. Use medications as directed including muscle relaxers if prescribed. Take anti-inflammatory medications as prescribed or OTC NSAIDs/Tylenol.  F/u with PCP in 7-10 days for reexamination, and please feel free to call or return to the urgent care at any time for any questions or concerns you may have and we will be happy to help you!   BACK PAIN RED FLAGS: If the back pain acutely worsens or there are any red flag symptoms such as numbness/tingling, leg weakness, saddle anesthesia, or loss of bowel/bladder control, go immediately to the ER. Follow up with [place] as scheduled or sooner if the pain does not begin to resolve or if it worsens before the follow up

## 2021-10-12 NOTE — ED Triage Notes (Signed)
Patient presents to UC for back pain -- patient would like to have an x-ray for reassurance. Patient recently saw her dad pass a week ago from his lungs filling with fluid.  She states she has has more anxiety since this happened.

## 2022-08-16 ENCOUNTER — Ambulatory Visit (INDEPENDENT_AMBULATORY_CARE_PROVIDER_SITE_OTHER): Payer: Medicaid Other

## 2022-08-16 ENCOUNTER — Ambulatory Visit
Admission: EM | Admit: 2022-08-16 | Discharge: 2022-08-16 | Disposition: A | Discharge status: Home / Self Care | Payer: Medicaid Other | Attending: Emergency Medicine | Admitting: Emergency Medicine

## 2022-08-16 DIAGNOSIS — J069 Acute upper respiratory infection, unspecified: Secondary | ICD-10-CM

## 2022-08-16 DIAGNOSIS — J4521 Mild intermittent asthma with (acute) exacerbation: Secondary | ICD-10-CM | POA: Diagnosis not present

## 2022-08-16 DIAGNOSIS — R0602 Shortness of breath: Secondary | ICD-10-CM | POA: Diagnosis not present

## 2022-08-16 MED ORDER — ALBUTEROL SULFATE HFA 108 (90 BASE) MCG/ACT IN AERS: 2.0000 | INHALATION_SPRAY | Freq: Four times a day (QID) | RESPIRATORY_TRACT | 2 refills | Status: AC | PRN

## 2022-08-16 MED ORDER — ALBUTEROL SULFATE (2.5 MG/3ML) 0.083% IN NEBU: 2.5000 mg | INHALATION_SOLUTION | Freq: Four times a day (QID) | RESPIRATORY_TRACT | 12 refills | Status: AC | PRN

## 2022-08-16 NOTE — Discharge Instructions (Addendum)
EKG shows your heart is beating in a normal pace and rhythm, heart rate is 78  Blood pressure on recheck is 118/78  Chest x-ray is negative  Symptoms today are most likely viral as your child has similar symptoms, respiratory symptoms flared and therefore we will work to calm this down  You have been given an inhaler or nebulizer treatment to be used every 4-6 hours as needed for shortness of breath and wheezing  If your symptoms continue to persist or worsen please follow-up with your primary doctor    You can take Tylenol and/or Ibuprofen as needed for fever reduction and pain relief.   For cough: honey 1/2 to 1 teaspoon (you can dilute the honey in water or another fluid).  You can also use guaifenesin and dextromethorphan for cough. You can use a humidifier for chest congestion and cough.  If you don't have a humidifier, you can sit in the bathroom with the hot shower running.      For sore throat: try warm salt water gargles, cepacol lozenges, throat spray, warm tea or water with lemon/honey, popsicles or ice, or OTC cold relief medicine for throat discomfort.   For congestion: take a daily anti-histamine like Zyrtec, Claritin, and a oral decongestant, such as pseudoephedrine.  You can also use Flonase 1-2 sprays in each nostril daily.   It is important to stay hydrated: drink plenty of fluids (water, gatorade/powerade/pedialyte, juices, or teas) to keep your throat moisturized and help further relieve irritation/discomfort.

## 2022-08-16 NOTE — ED Provider Notes (Addendum)
[facility]    CSN: [id number] Arrival date & time: 08/16/22  1623      History   Chief Complaint Chief Complaint  Patient presents with   Wheezing   Shortness of Breath    HPI Maria Jones is a 37 y.o. female.   She presents for evaluation of rhinorrhea, sore throat and a nonproductive cough present for 7 days.  Has been experience shortness of breath with exertion, intermittent wheezing and chest tightness over the last 3 to 4 days.  No known sick contacts prior but child now has similar symptoms, has tested negative for flu and COVID.  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  Has been using her child's rescue would inhaler which has been somewhat helpful but has not resolved symptoms.  History of childhood asthma.  Never smoker.     Past Medical History:  Diagnosis Date   ADHD    Anxiety     There are no problems to display for this patient.   Past Surgical History:  Procedure Laterality Date   C Section      LEEP Surgery      OB History   No obstetric history on file.      Home Medications    Prior to Admission medications   Medication Sig Start Date End Date Taking? Authorizing Provider  albuterol (VENTOLIN HFA) 108 (90 Base) MCG/ACT inhaler Inhale 2 puffs into the lungs every 4 (four) hours as needed for wheezing or shortness of breath. 11/11/18   Kem Boroughs B, FNP  benzonatate (TESSALON) 200 MG capsule Take one cap TID PRN cough 07/01/18   Lutricia Feil, PA-C  cyclobenzaprine (FLEXERIL) 5 MG tablet Take 1-2 tablets 3 times daily as needed 11/13/18   Enid Derry, PA-C  guaiFENesin-codeine (CHERATUSSIN AC) 100-10 MG/5ML syrup Take 5 mLs by mouth 3 (three) times daily as needed for cough. 07/01/18   Lutricia Feil, PA-C  lisdexamfetamine (VYVANSE) 60 MG capsule Take by mouth.    [provider]  VYVANSE 70 MG capsule TK 1 C PO QAM FOR FOCUS 06/03/18   [provider]    Family History Family  History  Problem Relation Age of Onset   Cirrhosis Father    Hepatitis C Father     Social History Social History   Tobacco Use   Smoking status: Never   Smokeless tobacco: Never  Vaping Use   Vaping Use: Never used  Substance Use Topics   Alcohol use: No   Drug use: No     Allergies   Sertraline and Antihistamines, chlorpheniramine-type   Review of Systems Review of Systems  Constitutional: Negative.   HENT:  Positive for rhinorrhea and sore throat. Negative for congestion, dental problem, drooling, ear discharge, ear pain, facial swelling, hearing loss, mouth sores, nosebleeds, postnasal drip, sinus pressure, sinus pain, sneezing, tinnitus, trouble swallowing and voice change.   Respiratory:  Positive for cough, shortness of breath and wheezing. Negative for apnea, choking, chest tightness and stridor.   Cardiovascular: Negative.   Gastrointestinal: Negative.   Neurological: Negative.      Physical Exam Triage Vital Signs ED Triage Vitals [08/16/22 1719]  Enc Vitals Group     BP 133/87     Pulse Rate 85     Resp      Temp 98.8 F (37.1 C)     Temp Source Oral     SpO2 100 %     Weight 165 lb (  74.8 kg)     Height  (1.676 m)     Head Circumference      Peak Flow      Pain Score 2     Pain Loc      Pain Edu?      Excl. in GC?    No data found.  Updated Vital Signs BP 133/87 (BP Location: Left Arm)   Pulse 85   Temp 98.8 F (37.1 C) (Oral)   Ht  (1.676 m)   Wt 165 lb (74.8 kg)   LMP 08/02/2022 (Approximate)   SpO2 100%   BMI 26.63 kg/m   Visual Acuity Right Eye Distance:   Left Eye Distance:   Bilateral Distance:    Right Eye Near:   Left Eye Near:    Bilateral Near:     Physical Exam Constitutional:      Appearance: Normal appearance.  HENT:     Head: Normocephalic.     Right Ear: Tympanic membrane, ear canal and external ear normal.     Left Ear: Tympanic membrane, ear canal and external ear normal.     Nose: Rhinorrhea  present.     Mouth/Throat:     Mouth: Mucous membranes are moist.     Pharynx: No posterior oropharyngeal erythema.  Cardiovascular:     Rate and Rhythm: Normal rate and regular rhythm.     Pulses: Normal pulses.     Heart sounds: Normal heart sounds.  Pulmonary:     Effort: Pulmonary effort is normal.     Breath sounds: Normal breath sounds.  Neurological:     Mental Status: She is alert and oriented to person, place, and time. Mental status is at baseline.      UC Treatments / Results  Labs (all labs ordered are listed, but only abnormal results are displayed) Labs Reviewed - No data to display  EKG   Radiology No results found.  Procedures Procedures (including critical care time)  Medications Ordered in UC Medications - No data to display  Initial Impression / Assessment and Plan / UC Course  I have reviewed the triage vital signs and the nursing notes.  Pertinent labs & imaging results that were available during my care of the patient were reviewed by me and considered in my medical decision making (see chart for details).  Mild intermittent asthma with acute exacerbation, viral URI with cough  Vital signs are stable, O2 saturation 100% on room air, lungs are clear to auscultation, chest x-ray is negative EKG shows normal sinus rhythm, discussed findings with patient, patient endorses elevation to blood pressure 133/87, on recheck has reduced to 118/78, patient is stable for outpatient management, endorses intolerance to steroids therefore will manage symptoms with albuterol inhaler and nebulizer to be used as needed, machine given in office, currently breast-feeding and therefore will defer use of prescription cough medicine and advised over-the-counter Delsym, may follow-up with urgent care or primary doctor as needed if symptoms persist or worsen Final Clinical Impressions(s) / UC Diagnoses   Final diagnoses:  None   Discharge Instructions   None    ED  Prescriptions   None    PDMP not reviewed this encounter.   Valinda Hoar, NP 08/16/22 1837    Valinda Hoar, NP 08/16/22 [phone]

## 2022-08-16 NOTE — ED Triage Notes (Signed)
Pt c/o wheezing onset x1 week ago, runny nose, chest tightness, denies any fevers. Pt states she used to have asthma when she was 14. Pt states her daughter was sick with similar sxs but neg for covid/flu.

## 2022-10-15 ENCOUNTER — Other Ambulatory Visit: Payer: Self-pay | Admitting: Family Medicine

## 2022-10-15 DIAGNOSIS — R221 Localized swelling, mass and lump, neck: Secondary | ICD-10-CM

## 2022-11-01 ENCOUNTER — Ambulatory Visit
Admission: RE | Admit: 2022-11-01 | Discharge: 2022-11-01 | Disposition: A | Discharge status: Home / Self Care | Payer: Medicaid Other | Source: Ambulatory Visit | Attending: Family Medicine | Admitting: Family Medicine

## 2022-11-01 DIAGNOSIS — R221 Localized swelling, mass and lump, neck: Secondary | ICD-10-CM | POA: Insufficient documentation
